# Patient Record
Sex: Female | Born: 1960 | Race: White | Hispanic: No | Marital: Married | Smoking: Former smoker
Health system: Southern US, Community
[De-identification: ages and names within clinical notes are randomized; demographics above are authoritative.]

## PROBLEM LIST (undated history)

## (undated) DIAGNOSIS — N6009 Solitary cyst of unspecified breast: Secondary | ICD-10-CM

## (undated) DIAGNOSIS — Z860100 Personal history of colon polyps, unspecified: Secondary | ICD-10-CM

## (undated) DIAGNOSIS — T7840XA Allergy, unspecified, initial encounter: Secondary | ICD-10-CM

## (undated) DIAGNOSIS — N809 Endometriosis, unspecified: Secondary | ICD-10-CM

## (undated) DIAGNOSIS — K219 Gastro-esophageal reflux disease without esophagitis: Secondary | ICD-10-CM

## (undated) HISTORY — PX: HIP ARTHROSCOPY: SHX668

## (undated) HISTORY — PX: COSMETIC SURGERY: SHX468

## (undated) HISTORY — PX: TONSILLECTOMY: SUR1361

## (undated) HISTORY — PX: OTHER SURGICAL HISTORY: SHX169

## (undated) HISTORY — PX: COLONOSCOPY: SHX174

## (undated) HISTORY — DX: Solitary cyst of unspecified breast: N60.09

## (undated) HISTORY — DX: Personal history of colon polyps, unspecified: Z86.0100

## (undated) HISTORY — DX: Personal history of colonic polyps: Z86.010

## (undated) HISTORY — DX: Gastro-esophageal reflux disease without esophagitis: K21.9

## (undated) HISTORY — PX: POLYPECTOMY: SHX149

## (undated) HISTORY — DX: Allergy, unspecified, initial encounter: T78.40XA

---

## 2006-11-26 ENCOUNTER — Encounter: Admission: RE | Admit: 2006-11-26 | Discharge: 2006-11-26 | Payer: Self-pay | Admitting: Obstetrics and Gynecology

## 2006-12-04 ENCOUNTER — Encounter: Admission: RE | Admit: 2006-12-04 | Discharge: 2006-12-04 | Payer: Self-pay | Admitting: Obstetrics and Gynecology

## 2006-12-04 HISTORY — PX: BREAST BIOPSY: SHX20

## 2008-06-16 ENCOUNTER — Encounter: Admission: RE | Admit: 2008-06-16 | Discharge: 2008-06-16 | Payer: Self-pay | Admitting: Obstetrics

## 2009-07-29 HISTORY — PX: OTHER SURGICAL HISTORY: SHX169

## 2012-06-22 ENCOUNTER — Other Ambulatory Visit: Payer: Self-pay | Admitting: Obstetrics

## 2012-06-22 DIAGNOSIS — Z1231 Encounter for screening mammogram for malignant neoplasm of breast: Secondary | ICD-10-CM

## 2012-07-20 ENCOUNTER — Ambulatory Visit
Admission: RE | Admit: 2012-07-20 | Discharge: 2012-07-20 | Disposition: A | Discharge status: Home / Self Care | Payer: Self-pay | Source: Ambulatory Visit | Attending: Obstetrics | Admitting: Obstetrics

## 2012-07-20 DIAGNOSIS — Z1231 Encounter for screening mammogram for malignant neoplasm of breast: Secondary | ICD-10-CM

## 2012-07-23 ENCOUNTER — Other Ambulatory Visit: Payer: Self-pay | Admitting: Obstetrics

## 2012-07-23 DIAGNOSIS — R928 Other abnormal and inconclusive findings on diagnostic imaging of breast: Secondary | ICD-10-CM

## 2012-07-29 ENCOUNTER — Ambulatory Visit
Admission: RE | Admit: 2012-07-29 | Discharge: 2012-07-29 | Disposition: A | Discharge status: Home / Self Care | Payer: Managed Care, Other (non HMO) | Source: Ambulatory Visit | Attending: Obstetrics | Admitting: Obstetrics

## 2012-07-29 DIAGNOSIS — R928 Other abnormal and inconclusive findings on diagnostic imaging of breast: Secondary | ICD-10-CM

## 2013-02-01 ENCOUNTER — Other Ambulatory Visit: Payer: Self-pay | Admitting: Obstetrics

## 2013-02-01 DIAGNOSIS — N6009 Solitary cyst of unspecified breast: Secondary | ICD-10-CM

## 2013-02-08 ENCOUNTER — Ambulatory Visit
Admission: RE | Admit: 2013-02-08 | Discharge: 2013-02-08 | Disposition: A | Discharge status: Home / Self Care | Payer: Managed Care, Other (non HMO) | Source: Ambulatory Visit | Attending: Obstetrics | Admitting: Obstetrics

## 2013-02-08 DIAGNOSIS — N6009 Solitary cyst of unspecified breast: Secondary | ICD-10-CM

## 2013-03-20 ENCOUNTER — Emergency Department: Payer: Self-pay | Admitting: Emergency Medicine

## 2013-03-23 ENCOUNTER — Emergency Department (HOSPITAL_COMMUNITY)
Admission: EM | Admit: 2013-03-23 | Discharge: 2013-03-23 | Disposition: A | Discharge status: Home / Self Care | Payer: Managed Care, Other (non HMO) | Attending: Emergency Medicine | Admitting: Emergency Medicine

## 2013-03-23 ENCOUNTER — Emergency Department (HOSPITAL_COMMUNITY): Payer: Managed Care, Other (non HMO)

## 2013-03-23 ENCOUNTER — Emergency Department (INDEPENDENT_AMBULATORY_CARE_PROVIDER_SITE_OTHER)
Admission: EM | Admit: 2013-03-23 | Discharge: 2013-03-23 | Disposition: A | Discharge status: Other Acute Inpatient Hospital | Payer: Managed Care, Other (non HMO) | Source: Home / Self Care | Attending: Family Medicine | Admitting: Family Medicine

## 2013-03-23 ENCOUNTER — Encounter (HOSPITAL_COMMUNITY): Payer: Self-pay

## 2013-03-23 ENCOUNTER — Encounter (HOSPITAL_COMMUNITY): Payer: Self-pay | Admitting: *Deleted

## 2013-03-23 DIAGNOSIS — Y92410 Unspecified street and highway as the place of occurrence of the external cause: Secondary | ICD-10-CM | POA: Insufficient documentation

## 2013-03-23 DIAGNOSIS — F17200 Nicotine dependence, unspecified, uncomplicated: Secondary | ICD-10-CM | POA: Insufficient documentation

## 2013-03-23 DIAGNOSIS — Z8739 Personal history of other diseases of the musculoskeletal system and connective tissue: Secondary | ICD-10-CM | POA: Insufficient documentation

## 2013-03-23 DIAGNOSIS — S0990XA Unspecified injury of head, initial encounter: Secondary | ICD-10-CM | POA: Insufficient documentation

## 2013-03-23 DIAGNOSIS — F29 Unspecified psychosis not due to a substance or known physiological condition: Secondary | ICD-10-CM | POA: Insufficient documentation

## 2013-03-23 DIAGNOSIS — IMO0002 Reserved for concepts with insufficient information to code with codable children: Secondary | ICD-10-CM | POA: Insufficient documentation

## 2013-03-23 DIAGNOSIS — F0781 Postconcussional syndrome: Secondary | ICD-10-CM | POA: Insufficient documentation

## 2013-03-23 DIAGNOSIS — Y9389 Activity, other specified: Secondary | ICD-10-CM | POA: Insufficient documentation

## 2013-03-23 DIAGNOSIS — R079 Chest pain, unspecified: Secondary | ICD-10-CM

## 2013-03-23 DIAGNOSIS — S298XXA Other specified injuries of thorax, initial encounter: Secondary | ICD-10-CM | POA: Insufficient documentation

## 2013-03-23 DIAGNOSIS — R0789 Other chest pain: Secondary | ICD-10-CM

## 2013-03-23 HISTORY — DX: Endometriosis, unspecified: N80.9

## 2013-03-23 LAB — CBC WITH DIFFERENTIAL/PLATELET
Basophils Absolute: 0 10*3/uL (ref 0.0–0.1)
Basophils Relative: 0 % (ref 0–1)
Eosinophils Relative: 2 % (ref 0–5)
Lymphocytes Relative: 33 % (ref 12–46)
Lymphs Abs: 2.7 10*3/uL (ref 0.7–4.0)
Monocytes Absolute: 0.4 10*3/uL (ref 0.1–1.0)
Monocytes Relative: 5 % (ref 3–12)
Neutro Abs: 5 10*3/uL (ref 1.7–7.7)
Neutrophils Relative %: 60 % (ref 43–77)
RBC: 4.76 MIL/uL (ref 3.87–5.11)
RDW: 12.2 % (ref 11.5–15.5)
WBC: 8.3 10*3/uL (ref 4.0–10.5)

## 2013-03-23 LAB — POCT I-STAT, CHEM 8
BUN: 13 mg/dL (ref 6–23)
Glucose, Bld: 95 mg/dL (ref 70–99)
HCT: 46 % (ref 36.0–46.0)
Hemoglobin: 15.6 g/dL — ABNORMAL HIGH (ref 12.0–15.0)
TCO2: 35 mmol/L (ref 0–100)
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
## 2013-03-23 NOTE — ED Notes (Signed)
Pt returned from radiology and placed on monitor 
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
## 2013-03-24 NOTE — ED Provider Notes (Signed)
Medical screening examination/treatment/procedure(s) were performed by non-physician practitioner and as supervising physician I was immediately available for consultation/collaboration.   MORENO-COLL,[name]; MD  [name], MD 03/24/13 1019 

## 2013-08-30 ENCOUNTER — Other Ambulatory Visit: Payer: Self-pay | Admitting: Obstetrics

## 2013-08-30 DIAGNOSIS — N6310 Unspecified lump in the right breast, unspecified quadrant: Secondary | ICD-10-CM

## 2013-09-21 ENCOUNTER — Ambulatory Visit
Admission: RE | Admit: 2013-09-21 | Discharge: 2013-09-21 | Disposition: A | Discharge status: Home / Self Care | Payer: Managed Care, Other (non HMO) | Source: Ambulatory Visit | Attending: Obstetrics | Admitting: Obstetrics

## 2013-09-21 DIAGNOSIS — N6310 Unspecified lump in the right breast, unspecified quadrant: Secondary | ICD-10-CM

## 2014-01-10 ENCOUNTER — Encounter: Payer: Self-pay | Admitting: Obstetrics

## 2014-11-02 ENCOUNTER — Other Ambulatory Visit: Payer: Self-pay | Admitting: Obstetrics

## 2014-11-02 DIAGNOSIS — N6310 Unspecified lump in the right breast, unspecified quadrant: Secondary | ICD-10-CM

## 2014-11-15 ENCOUNTER — Ambulatory Visit
Admission: RE | Admit: 2014-11-15 | Discharge: 2014-11-15 | Disposition: A | Discharge status: Home / Self Care | Payer: Managed Care, Other (non HMO) | Source: Ambulatory Visit | Attending: Obstetrics | Admitting: Obstetrics

## 2014-11-15 DIAGNOSIS — N6310 Unspecified lump in the right breast, unspecified quadrant: Secondary | ICD-10-CM

## 2015-02-21 ENCOUNTER — Encounter: Payer: Self-pay | Admitting: Obstetrics

## 2015-05-31 ENCOUNTER — Other Ambulatory Visit: Payer: Self-pay | Admitting: Obstetrics

## 2015-05-31 DIAGNOSIS — N6452 Nipple discharge: Secondary | ICD-10-CM

## 2015-06-04 ENCOUNTER — Ambulatory Visit
Admission: RE | Admit: 2015-06-04 | Discharge: 2015-06-04 | Disposition: A | Discharge status: Home / Self Care | Payer: Managed Care, Other (non HMO) | Source: Ambulatory Visit | Attending: Obstetrics | Admitting: Obstetrics

## 2015-06-04 ENCOUNTER — Other Ambulatory Visit: Payer: Self-pay | Admitting: Obstetrics

## 2015-06-04 DIAGNOSIS — N6452 Nipple discharge: Secondary | ICD-10-CM

## 2015-06-05 ENCOUNTER — Ambulatory Visit
Admission: RE | Admit: 2015-06-05 | Discharge: 2015-06-05 | Disposition: A | Discharge status: Home / Self Care | Payer: Managed Care, Other (non HMO) | Source: Ambulatory Visit | Attending: Obstetrics | Admitting: Obstetrics

## 2015-06-05 ENCOUNTER — Other Ambulatory Visit: Payer: Self-pay | Admitting: Obstetrics

## 2015-06-05 DIAGNOSIS — N6452 Nipple discharge: Secondary | ICD-10-CM

## 2015-06-05 HISTORY — PX: BREAST BIOPSY: SHX20

## 2015-06-22 ENCOUNTER — Other Ambulatory Visit: Payer: Self-pay | Admitting: General Surgery

## 2015-06-22 DIAGNOSIS — R928 Other abnormal and inconclusive findings on diagnostic imaging of breast: Secondary | ICD-10-CM

## 2015-07-04 ENCOUNTER — Other Ambulatory Visit: Payer: Self-pay | Admitting: General Surgery

## 2015-07-04 DIAGNOSIS — R928 Other abnormal and inconclusive findings on diagnostic imaging of breast: Secondary | ICD-10-CM

## 2015-07-26 ENCOUNTER — Encounter (HOSPITAL_BASED_OUTPATIENT_CLINIC_OR_DEPARTMENT_OTHER): Payer: Self-pay | Admitting: *Deleted

## 2015-07-27 ENCOUNTER — Ambulatory Visit
Admission: RE | Admit: 2015-07-27 | Discharge: 2015-07-27 | Disposition: A | Discharge status: Home / Self Care | Payer: Managed Care, Other (non HMO) | Source: Ambulatory Visit | Attending: General Surgery | Admitting: General Surgery

## 2015-07-27 DIAGNOSIS — R928 Other abnormal and inconclusive findings on diagnostic imaging of breast: Secondary | ICD-10-CM

## 2015-08-01 ENCOUNTER — Ambulatory Visit (HOSPITAL_BASED_OUTPATIENT_CLINIC_OR_DEPARTMENT_OTHER)
Admission: RE | Admit: 2015-08-01 | Discharge: 2015-08-01 | Disposition: A | Discharge status: Home / Self Care | Payer: Managed Care, Other (non HMO) | Source: Ambulatory Visit | Attending: General Surgery | Admitting: General Surgery

## 2015-08-01 ENCOUNTER — Encounter (HOSPITAL_BASED_OUTPATIENT_CLINIC_OR_DEPARTMENT_OTHER): Payer: Self-pay | Admitting: *Deleted

## 2015-08-01 ENCOUNTER — Ambulatory Visit (HOSPITAL_BASED_OUTPATIENT_CLINIC_OR_DEPARTMENT_OTHER): Payer: Managed Care, Other (non HMO) | Admitting: Anesthesiology

## 2015-08-01 ENCOUNTER — Encounter (HOSPITAL_BASED_OUTPATIENT_CLINIC_OR_DEPARTMENT_OTHER)
Admission: RE | Disposition: A | Discharge status: Home / Self Care | Payer: Self-pay | Source: Ambulatory Visit | Attending: General Surgery

## 2015-08-01 ENCOUNTER — Ambulatory Visit
Admission: RE | Admit: 2015-08-01 | Discharge: 2015-08-01 | Disposition: A | Discharge status: Home / Self Care | Payer: Managed Care, Other (non HMO) | Source: Ambulatory Visit | Attending: General Surgery | Admitting: General Surgery

## 2015-08-01 DIAGNOSIS — N6452 Nipple discharge: Secondary | ICD-10-CM | POA: Insufficient documentation

## 2015-08-01 DIAGNOSIS — Z87891 Personal history of nicotine dependence: Secondary | ICD-10-CM | POA: Insufficient documentation

## 2015-08-01 DIAGNOSIS — N6082 Other benign mammary dysplasias of left breast: Secondary | ICD-10-CM | POA: Diagnosis not present

## 2015-08-01 DIAGNOSIS — D242 Benign neoplasm of left breast: Secondary | ICD-10-CM | POA: Insufficient documentation

## 2015-08-01 DIAGNOSIS — R928 Other abnormal and inconclusive findings on diagnostic imaging of breast: Secondary | ICD-10-CM | POA: Diagnosis present

## 2015-08-01 HISTORY — PX: RADIOACTIVE SEED GUIDED EXCISIONAL BREAST BIOPSY: SHX6490

## 2015-08-01 HISTORY — PX: BREAST EXCISIONAL BIOPSY: SUR124

## 2015-08-01 LAB — POCT HEMOGLOBIN-HEMACUE: HEMOGLOBIN: 14 g/dL (ref 12.0–15.0)

## 2015-08-01 SURGERY — RADIOACTIVE SEED GUIDED BREAST BIOPSY
Anesthesia: General | Site: Breast | Laterality: Left

## 2015-08-01 MED ORDER — PROPOFOL 10 MG/ML IV BOLUS
INTRAVENOUS | Status: DC | PRN
  Administered 2015-08-01: 150 mg via INTRAVENOUS

## 2015-08-01 MED ORDER — FENTANYL CITRATE (PF) 100 MCG/2ML IJ SOLN
INTRAMUSCULAR | Status: AC
  Filled 2015-08-01: qty 2

## 2015-08-01 MED ORDER — ACETAMINOPHEN 325 MG PO TABS: 650.0000 mg | ORAL_TABLET | ORAL | Status: DC | PRN

## 2015-08-01 MED ORDER — SCOPOLAMINE 1 MG/3DAYS TD PT72: 1.0000 | MEDICATED_PATCH | Freq: Once | TRANSDERMAL | Status: DC | PRN

## 2015-08-01 MED ORDER — SODIUM CHLORIDE 0.9 % IV SOLN: 250.0000 mL | INTRAVENOUS | Status: DC | PRN

## 2015-08-01 MED ORDER — GLYCOPYRROLATE 0.2 MG/ML IJ SOLN: 0.2000 mg | Freq: Once | INTRAMUSCULAR | Status: DC | PRN

## 2015-08-01 MED ORDER — DEXAMETHASONE SODIUM PHOSPHATE 4 MG/ML IJ SOLN
INTRAMUSCULAR | Status: DC | PRN
  Administered 2015-08-01: 10 mg via INTRAVENOUS

## 2015-08-01 MED ORDER — LACTATED RINGERS IV SOLN
INTRAVENOUS | Status: DC
  Administered 2015-08-01 (×2): via INTRAVENOUS

## 2015-08-01 MED ORDER — ACETAMINOPHEN 650 MG RE SUPP: 650.0000 mg | RECTAL | Status: DC | PRN

## 2015-08-01 MED ORDER — BUPIVACAINE-EPINEPHRINE 0.5% -1:200000 IJ SOLN
INTRAMUSCULAR | Status: DC | PRN
  Administered 2015-08-01: 20 mL

## 2015-08-01 MED ORDER — LIDOCAINE HCL (CARDIAC) 20 MG/ML IV SOLN
INTRAVENOUS | Status: DC | PRN
  Administered 2015-08-01: 80 mg via INTRAVENOUS

## 2015-08-01 MED ORDER — FENTANYL CITRATE (PF) 100 MCG/2ML IJ SOLN
INTRAMUSCULAR | Status: AC
  Filled 2015-08-01: qty 6

## 2015-08-01 MED ORDER — OXYCODONE HCL 5 MG PO TABS
5.0000 mg | ORAL_TABLET | ORAL | Status: DC | PRN
Start: 2015-08-01 — End: 2015-08-01

## 2015-08-01 MED ORDER — MIDAZOLAM HCL 2 MG/2ML IJ SOLN
INTRAMUSCULAR | Status: AC
  Filled 2015-08-01: qty 2

## 2015-08-01 MED ORDER — MIDAZOLAM HCL 2 MG/2ML IJ SOLN
1.0000 mg | INTRAMUSCULAR | Status: DC | PRN
  Administered 2015-08-01: 2 mg via INTRAVENOUS

## 2015-08-01 MED ORDER — FENTANYL CITRATE (PF) 100 MCG/2ML IJ SOLN
50.0000 ug | INTRAMUSCULAR | Status: AC | PRN
  Administered 2015-08-01: 100 ug via INTRAVENOUS
  Administered 2015-08-01 (×2): 50 ug via INTRAVENOUS

## 2015-08-01 MED ORDER — OXYCODONE-ACETAMINOPHEN 5-325 MG PO TABS: 1.0000 | ORAL_TABLET | ORAL | Status: DC | PRN

## 2015-08-01 MED ORDER — CEFAZOLIN SODIUM-DEXTROSE 2-3 GM-% IV SOLR
2.0000 g | INTRAVENOUS | Status: AC
  Administered 2015-08-01: 2 g via INTRAVENOUS

## 2015-08-01 MED ORDER — ONDANSETRON HCL 4 MG/2ML IJ SOLN
INTRAMUSCULAR | Status: DC | PRN
  Administered 2015-08-01: 4 mg via INTRAVENOUS

## 2015-08-01 MED ORDER — BUPIVACAINE-EPINEPHRINE (PF) 0.5% -1:200000 IJ SOLN
INTRAMUSCULAR | Status: AC
  Filled 2015-08-01: qty 30

## 2015-08-01 MED ORDER — SODIUM CHLORIDE 0.9 % IJ SOLN: 3.0000 mL | Freq: Two times a day (BID) | INTRAMUSCULAR | Status: DC

## 2015-08-01 MED ORDER — SODIUM CHLORIDE 0.9 % IJ SOLN: 3.0000 mL | INTRAMUSCULAR | Status: DC | PRN

## 2015-08-01 SURGICAL SUPPLY — 50 items
BINDER BREAST MEDIUM (GAUZE/BANDAGES/DRESSINGS) ×3 IMPLANT
BLADE HEX COATED 2.75 (ELECTRODE) ×3 IMPLANT
BLADE SURG 10 STRL SS (BLADE) IMPLANT
BLADE SURG 15 STRL LF DISP TIS (BLADE) ×1 IMPLANT
BLADE SURG 15 STRL SS (BLADE) ×2
CANISTER SUC SOCK COL 7IN (MISCELLANEOUS) IMPLANT
CANISTER SUCT 1200ML W/VALVE (MISCELLANEOUS) ×3 IMPLANT
CHLORAPREP W/TINT 26ML (MISCELLANEOUS) ×3 IMPLANT
CLIP TI LARGE 6 (CLIP) IMPLANT
CLOSURE WOUND 1/2 X4 (GAUZE/BANDAGES/DRESSINGS) ×1
COVER BACK TABLE 60X90IN (DRAPES) ×3 IMPLANT
COVER MAYO STAND STRL (DRAPES) IMPLANT
COVER PROBE W GEL 5X96 (DRAPES) ×3 IMPLANT
DECANTER SPIKE VIAL GLASS SM (MISCELLANEOUS) IMPLANT
DEVICE DUBIN W/COMP PLATE 8390 (MISCELLANEOUS) ×3 IMPLANT
DRAPE UNIVERSAL PACK (DRAPES) ×3 IMPLANT
DRAPE UTILITY XL STRL (DRAPES) ×3 IMPLANT
ELECT REM PT RETURN 9FT ADLT (ELECTROSURGICAL) ×3
ELECTRODE REM PT RTRN 9FT ADLT (ELECTROSURGICAL) ×1 IMPLANT
GLOVE BIO SURGEON STRL SZ 6 (GLOVE) ×3 IMPLANT
GLOVE BIOGEL PI IND STRL 6.5 (GLOVE) ×1 IMPLANT
GLOVE BIOGEL PI INDICATOR 6.5 (GLOVE) ×2
GLOVE SURG SS PI 7.0 STRL IVOR (GLOVE) ×3 IMPLANT
GOWN STRL REUS W/ TWL LRG LVL3 (GOWN DISPOSABLE) ×1 IMPLANT
GOWN STRL REUS W/TWL 2XL LVL3 (GOWN DISPOSABLE) ×3 IMPLANT
GOWN STRL REUS W/TWL LRG LVL3 (GOWN DISPOSABLE) ×2
KIT MARKER MARGIN INK (KITS) ×3 IMPLANT
LIQUID BAND (GAUZE/BANDAGES/DRESSINGS) ×3 IMPLANT
NEEDLE HYPO 25X1 1.5 SAFETY (NEEDLE) ×3 IMPLANT
NS IRRIG 1000ML POUR BTL (IV SOLUTION) ×3 IMPLANT
PACK BASIN DAY SURGERY FS (CUSTOM PROCEDURE TRAY) ×3 IMPLANT
PENCIL BUTTON HOLSTER BLD 10FT (ELECTRODE) ×3 IMPLANT
SLEEVE SCD COMPRESS KNEE MED (MISCELLANEOUS) ×3 IMPLANT
SPONGE GAUZE 4X4 12PLY STER LF (GAUZE/BANDAGES/DRESSINGS) ×3 IMPLANT
SPONGE LAP 18X18 X RAY DECT (DISPOSABLE) ×3 IMPLANT
STAPLER VISISTAT 35W (STAPLE) IMPLANT
STRIP CLOSURE SKIN 1/2X4 (GAUZE/BANDAGES/DRESSINGS) ×2 IMPLANT
SUT MON AB 4-0 PC3 18 (SUTURE) ×3 IMPLANT
SUT SILK 2 0 SH (SUTURE) IMPLANT
SUT VIC AB 2-0 SH 18 (SUTURE) IMPLANT
SUT VIC AB 3-0 SH 27 (SUTURE)
SUT VIC AB 3-0 SH 27X BRD (SUTURE) IMPLANT
SUT VICRYL 3-0 CR8 SH (SUTURE) ×3 IMPLANT
SYR BULB 3OZ (MISCELLANEOUS) ×3 IMPLANT
SYR CONTROL 10ML LL (SYRINGE) ×3 IMPLANT
TOWEL OR 17X24 6PK STRL BLUE (TOWEL DISPOSABLE) ×3 IMPLANT
TOWEL OR NON WOVEN STRL DISP B (DISPOSABLE) IMPLANT
TUBE CONNECTING 20'X1/4 (TUBING) ×1
TUBE CONNECTING 20X1/4 (TUBING) ×2 IMPLANT
YANKAUER SUCT BULB TIP NO VENT (SUCTIONS) ×3 IMPLANT

## 2015-08-01 NOTE — Discharge Instructions (Addendum)
[facility] °Office Phone Number [phone] ° °BREAST BIOPSY/ PARTIAL MASTECTOMY: POST OP INSTRUCTIONS ° °Always review your discharge instruction sheet given to you by the facility where your surgery was performed. ° °IF YOU HAVE DISABILITY OR FAMILY LEAVE FORMS, YOU MUST BRING THEM TO THE OFFICE FOR PROCESSING.  DO NOT GIVE THEM TO YOUR DOCTOR. ° °1. A prescription for pain medication may be given to you upon discharge.  Take your pain medication as prescribed, if needed.  If narcotic pain medicine is not needed, then you may take acetaminophen (Tylenol) or ibuprofen (Advil) as needed. °2. Take your usually prescribed medications unless otherwise directed °3. If you need a refill on your pain medication, please contact your pharmacy.  They will contact our office to request authorization.  Prescriptions will not be filled after 5pm or on week-ends. °4. You should eat very light the first 24 hours after surgery, such as soup, crackers, pudding, etc.  Resume your normal diet the day after surgery. °5. Most patients will experience some swelling and bruising in the breast.  Ice packs and a good support bra will help.  Swelling and bruising can take several days to resolve.  °6. It is common to experience some constipation if taking pain medication after surgery.  Increasing fluid intake and taking a stool softener will usually help or prevent this problem from occurring.  A mild laxative (Milk of Magnesia or Miralax) should be taken according to package directions if there are no bowel movements after 48 hours. °7. Unless discharge instructions indicate otherwise, you may remove your bandages 48 hours after surgery, and you may shower at that time.  You may have steri-strips (small skin tapes) in place directly over the incision.  These strips should be left on the skin for 7-10 days.   Any sutures or staples will be removed at the office during your follow-up visit. °8. ACTIVITIES:  You may resume  regular daily activities (gradually increasing) beginning the next day.  Wearing a good support bra or sports bra (or the breast binder) minimizes pain and swelling.  You may have sexual intercourse when it is comfortable. °a. You may drive when you no longer are taking prescription pain medication, you can comfortably wear a seatbelt, and you can safely maneuver your car and apply brakes. °b. RETURN TO WORK:  __________1 week_______________ °9. You should see your doctor in the office for a follow-up appointment approximately two weeks after your surgery.  Your doctor’s nurse will typically make your follow-up appointment when she calls you with your pathology report.  Expect your pathology report 2-3 business days after your surgery.  You may call to check if you do not hear from us after three days. ° ° °WHEN TO CALL YOUR DOCTOR: °1. Fever over 101.0 °2. Nausea and/or vomiting. °3. Extreme swelling or bruising. °4. Continued bleeding from incision. °5. Increased pain, redness, or drainage from the incision. ° °The clinic staff is available to answer your questions during regular business hours.  Please don’t hesitate to call and ask to speak to one of the nurses for clinical concerns.  If you have a medical emergency, go to the nearest emergency room or call 911.  A surgeon from [facility] is always on call at the hospital. ° °For further questions, please visit [email or web address]  ° ° °Post Anesthesia Home Care Instructions ° °Activity: °Get plenty of rest for the remainder of the day. A responsible adult should stay with you for 24   hours following the procedure.  °For the next 24 hours, DO NOT: °-Drive a car °-Operate machinery °-Drink alcoholic beverages °-Take any medication unless instructed by your physician °-Make any legal decisions or sign important papers. ° °Meals: °Start with liquid foods such as gelatin or soup. Progress to regular foods as tolerated. Avoid greasy, spicy, heavy  foods. If nausea and/or vomiting occur, drink only clear liquids until the nausea and/or vomiting subsides. Call your physician if vomiting continues. ° °Special Instructions/Symptoms: °Your throat may feel dry or sore from the anesthesia or the breathing tube placed in your throat during surgery. If this causes discomfort, gargle with warm salt water. The discomfort should disappear within 24 hours. ° °If you had a scopolamine patch placed behind your ear for the management of post- operative nausea and/or vomiting: ° °1. The medication in the patch is effective for 72 hours, after which it should be removed.  Wrap patch in a tissue and discard in the trash. Wash hands thoroughly with soap and water. °2. You may remove the patch earlier than 72 hours if you experience unpleasant side effects which may include dry mouth, dizziness or visual disturbances. °3. Avoid touching the patch. Wash your hands with soap and water after contact with the patch. °  ° °

## 2015-08-01 NOTE — Op Note (Signed)
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    Pre-operative Diagnosis: bloody nipple discharge, abnormal mammogram  Post-operative Diagnosis: same  Surgeon: ,   Anesthesia: General endotracheal anesthesia  ASA Class: 1  Procedure Details  The patient was seen in the Holding Room. The risks, benefits, complications, treatment options, and expected outcomes were discussed with the patient. The possibilities of bleeding, infection, the need for additional procedures, failure to diagnose a condition, and creating a complication requiring transfusion or operation were discussed with the patient. The patient concurred with the proposed plan, giving informed consent.  The site of surgery properly noted/marked. The patient was taken to Operating Room # 2, identified, and the procedure verified as left Breast seed localized excisional biopsy. A Time Out was held and the above information confirmed.  The left breast and chest were prepped and draped in standard fashion. The lumpectomy was performed by creating a circumareolar incision over the lower outer quadrant of the breast over the previously placed radioactive seed. Sharp dissection was used under the nipple.   Dissection was carried down to around the point of maximum signal intensity. The cautery was used to perform the dissection.  Hemostasis was achieved with cautery. The edges of the cavity were marked with large clips, with one each medial, lateral, inferior and superior, and two clips posteriorly.   The specimen was inked with the margin marker paint kit.    Specimen radiography confirmed inclusion of the mammographic lesion, the clip, and the seed.  The background signal in the breast was zero.  The wound was irrigated and closed with 3-0 vicryl in layers and 4-0 monocryl  subcuticular suture.      Sterile dressings were applied. At the end of the operation, all sponge, instrument, and needle counts were correct.  Findings: grossly clear surgical margins and no adenopathy.  Anterior margin is nipple  Estimated Blood Loss:  min         Specimens: left breast excisional biopsy         Complications:  None; patient tolerated the procedure well.         Disposition: PACU - hemodynamically stable.         Condition: stable   

## 2015-08-01 NOTE — Transfer of Care (Signed)
Immediate Anesthesia Transfer of Care Note  Patient: Martha Frank  Procedure(s) Performed: Procedure(s): RADIOACTIVE SEED GUIDED EXCISIONAL LEFT BREAST BIOPSY (Left)  Patient Location: PACU  Anesthesia Type:General  Level of Consciousness: sedated  Airway & Oxygen Therapy: Patient Spontanous Breathing and Patient connected to face mask oxygen  Post-op Assessment: Report given to RN and Post -op Vital signs reviewed and stable  Post vital signs: Reviewed and stable  Last Vitals:  Filed Vitals:   08/01/15 1433  BP:   Pulse: 65  Temp:   Resp: 34    Complications: No apparent anesthesia complications

## 2015-08-01 NOTE — Anesthesia Procedure Notes (Signed)
Procedure Name: LMA Insertion Date/Time: 08/01/2015 1:45 PM Performed by: Maryella Shivers Pre-anesthesia Checklist: Patient identified, Emergency Drugs available, Suction available and Patient being monitored Patient Re-evaluated:Patient Re-evaluated prior to inductionOxygen Delivery Method: Circle System Utilized Preoxygenation: Pre-oxygenation with 100% oxygen Intubation Type: IV induction Ventilation: Mask ventilation without difficulty LMA: LMA inserted LMA Size: 4.0 Number of attempts: 1 Airway Equipment and Method: Bite block Placement Confirmation: positive ETCO2 Tube secured with: Tape Dental Injury: Teeth and Oropharynx as per pre-operative assessment

## 2015-08-01 NOTE — Anesthesia Preprocedure Evaluation (Signed)
Anesthesia Evaluation  Patient identified by MRN, date of birth, ID band Patient awake    Reviewed: Allergy & Precautions, NPO status , Patient's Chart, lab work & pertinent test results  Airway Mallampati: I  TM Distance: >3 FB Neck ROM: Full    Dental  (+) Teeth Intact, Dental Advisory Given   Pulmonary former smoker,  breath sounds clear to auscultation        Cardiovascular Rhythm:Regular Rate:Normal     Neuro/Psych    GI/Hepatic   Endo/Other    Renal/GU      Musculoskeletal   Abdominal   Peds  Hematology   Anesthesia Other Findings   Reproductive/Obstetrics                             Anesthesia Physical Anesthesia Plan  ASA: I  Anesthesia Plan: General   Post-op Pain Management:    Induction: Intravenous  Airway Management Planned: LMA  Additional Equipment:   Intra-op Plan:   Post-operative Plan: Extubation in OR  Informed Consent: I have reviewed the patients History and Physical, chart, labs and discussed the procedure including the risks, benefits and alternatives for the proposed anesthesia with the patient or authorized representative who has indicated his/her understanding and acceptance.   Dental advisory given  Plan Discussed with: CRNA, Anesthesiologist and Surgeon  Anesthesia Plan Comments:        Anesthesia Quick Evaluation  

## 2015-08-01 NOTE — Interval H&P Note (Signed)
History and Physical Interval Note:  08/01/2015 1:27 PM  Martha Frank  has presented today for surgery, with the diagnosis of ABNORMAL MAMMOGRAM, LEFT BREAST PAPILLOMA  The various methods of treatment have been discussed with the patient and family. After consideration of risks, benefits and other options for treatment, the patient has consented to  Procedure(s): RADIOACTIVE SEED GUIDED EXCISIONAL LEFT BREAST BIOPSY (Left) as a surgical intervention .  The patient's history has been reviewed, patient examined, no change in status, stable for surgery.  I have reviewed the patient's chart and labs.  Questions were answered to the patient's satisfaction.     Ryleeann Urquiza

## 2015-08-01 NOTE — H&P (Signed)
Martha Frank Location: [facility] Patient #: [id number] DOB: [birth year] Married / Language: English / Race: White Female  History of Present Illness Patient words: papilloma L breast.  The patient is a 54 year old female who presents with a complaint of nipple discharge. The patient is a 53 year old female referred by Dr. Luberta Robertson for consultation for left bloody nipple discharge and abnormal mammogram. The patient was having bloody nipple discharge and had diagnostic imaging. A small mass was seen at 2:30 on the left that was 6 millimeters in greatest dimension. A ductogram was attempted but was not successful. The mass was biopsied and was found to be intraductal papilloma on pathology. The patient is referred for excisional biopsy. Of note, she did have a mother and maternal aunt who had breast cancer. Her maternal grandmother had some type of cancer but her family is not sure what type this was. Her father had prostate cancer. She has not had to have breast biopsies in the past. The nipple discharge has significantly decreased since the core needle biopsy because of the swelling that she has in this breast. It is no longer spontaneous. She does have some soreness of the breast since the biopsy.   Other Problems Bladder Problems  Past Surgical History Breast Biopsy Left. multiple Hip Surgery Right. Tonsillectomy  Diagnostic Studies History Colonoscopy never Mammogram within last year Pap Smear 1-5 years ago  Allergies No Known Drug Allergies06/24/2016  Medication History Myrbetriq (25MG  Tablet ER 24HR, Oral) Active. Vitamin D (2000UNIT Capsule, Oral) Active. Medications Reconciled  Social History Alcohol use Moderate alcohol use. Caffeine use Carbonated beverages, Tea. Illicit drug use Remotely quit drug use. Tobacco use Former smoker.  Family History Breast Cancer Family Members In General, Mother. Diabetes Mellitus  Father. Heart Disease Father. Heart disease in female family member before age 37 Prostate Cancer Father.  Pregnancy / Birth History  Age at menarche 37 years. Age of menopause 45-50 Contraceptive History Oral contraceptives. Gravida 2 Irregular periods Maternal age 38-30 Para 2  Review of Systems General Not Present- Appetite Loss, Chills, Fatigue, Fever, Night Sweats, Weight Gain and Weight Loss. Skin Not Present- Change in Wart/Mole, Dryness, Hives, Jaundice, New Lesions, Non-Healing Wounds, Rash and Ulcer. HEENT Not Present- Earache, Hearing Loss, Hoarseness, Nose Bleed, Oral Ulcers, Ringing in the Ears, Seasonal Allergies, Sinus Pain, Sore Throat, Visual Disturbances, Wears glasses/contact lenses and Yellow Eyes. Respiratory Not Present- Bloody sputum, Chronic Cough, Difficulty Breathing, Snoring and Wheezing. Breast Present- Nipple Discharge. Not Present- Breast Mass, Breast Pain and Skin Changes. Cardiovascular Not Present- Chest Pain, Difficulty Breathing Lying Down, Leg Cramps, Palpitations, Rapid Heart Rate, Shortness of Breath and Swelling of Extremities. Gastrointestinal Not Present- Abdominal Pain, Bloating, Bloody Stool, Change in Bowel Habits, Chronic diarrhea, Constipation, Difficulty Swallowing, Excessive gas, Gets full quickly at meals, Hemorrhoids, Indigestion, Nausea, Rectal Pain and Vomiting. Female Genitourinary Present- Frequency, Nocturia and Urgency. Not Present- Painful Urination and Pelvic Pain. Musculoskeletal Present- Back Pain. Not Present- Joint Pain, Joint Stiffness, Muscle Pain, Muscle Weakness and Swelling of Extremities. Endocrine Present- Cold Intolerance. Not Present- Excessive Hunger, Hair Changes, Heat Intolerance, Hot flashes and New Diabetes.   Vitals  Wt Readings from Last 3 Encounters:  08/01/15 50.349 kg (111 lb)   Temp Readings from Last 3 Encounters:  08/01/15 98.2 F (36.8 C) Oral  03/23/13 97.6 F (36.4 C) Oral  03/23/13 97  F (36.1 C) Oral   BP Readings from Last 3 Encounters:  08/01/15 118/64  03/23/13 116/71  03/23/13 118/73  Pulse Readings from Last 3 Encounters:  08/01/15 67  03/23/13 62  03/23/13 78       Physical Exam General Mental Status-Alert. General Appearance-Consistent with stated age. Hydration-Well hydrated. Voice-Normal.  Head and Neck Head-normocephalic, atraumatic with no lesions or palpable masses. Trachea-midline. Thyroid Gland Characteristics - normal size and consistency.  Eye Eyeball - Bilateral-Extraocular movements intact. Sclera/Conjunctiva - Bilateral-No scleral icterus.  Chest and Lung Exam Chest and lung exam reveals -quiet, even and easy respiratory effort with no use of accessory muscles and on auscultation, normal breath sounds, no adventitious sounds and normal vocal resonance. Inspection Chest Wall - Normal. Back - normal.  Breast Note: Bilateral breasts have minimal ptosis. The left breast is larger than the right breast. There is no significant bruising. There is a small amount of nipple retraction on the left. Nipple discharge can be elicited with pressure on the nipple. This is serosanguineous. The duct where the discharge is seen appears to be approximately at 4:00. There is no mass in either breast. There is no axillary adenopathy on either side. The right side has no nipple discharge or nipple retraction. Breasts are relatively dense bilaterally.   Cardiovascular Cardiovascular examination reveals -normal heart sounds, regular rate and rhythm with no murmurs and normal pedal pulses bilaterally.  Abdomen Inspection Inspection of the abdomen reveals - No Hernias. Palpation/Percussion Palpation and Percussion of the abdomen reveal - Soft, Non Tender, No Rebound tenderness, No Rigidity (guarding) and No hepatosplenomegaly. Auscultation Auscultation of the abdomen reveals - Bowel sounds normal.  Neurologic Neurologic  evaluation reveals -alert and oriented x 3 with no impairment of recent or remote memory. Mental Status-Normal.  Musculoskeletal Global Assessment -Note: no gross deformities.  Normal Exam - Left-Upper Extremity Strength Normal and Lower Extremity Strength Normal. Normal Exam - Right-Upper Extremity Strength Normal and Lower Extremity Strength Normal.  Lymphatic Head & Neck  General Head & Neck Lymphatics: Bilateral - Description - Normal. Axillary  General Axillary Region: Bilateral - Description - Normal. Tenderness - Non Tender. Femoral & Inguinal  Generalized Femoral & Inguinal Lymphatics: Bilateral - Description - No Generalized lymphadenopathy.    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  I discussed a seed localized excisional biopsy with the patient.  The surgical procedure was described to the patient. I discussed the incision type and location and that we would need radiology involved on with a wire or seed marker and/or sentinel node.  The risks and benefits of the procedure were described to the patient and she wishes to proceed.  We discussed the risks bleeding, infection, damage to other structures, need for further procedures/surgeries. We discussed the risk of seroma. The patient was advised if the area in the breast in cancer, we may need to go back to surgery for additional tissue to obtain negative margins or for a lymph node biopsy. The patient was advised that these are the most common complications, but that others can occur as well. They were advised against taking aspirin or other anti-inflammatory agents/blood thinners the week before surgery.  She understands and wishes to proceed. BLOODY DISCHARGE FROM LEFT NIPPLE (611.79  N64.52) Impression: I do think her risk of cancer is higher than average given her family  history.  Certainly if she does have cancer, she would need genetic testing. ABNORMAL MAMMOGRAM OF LEFT BREAST (793.80  R92.8) Current Plans  Pt Education -  [facility] Breast Biopsy Instructions (FLB): discussed with patient and provided information. Follow up for surgery, then in clinic 2-3 weeks post op. Schedule for Surgery

## 2015-08-01 NOTE — Anesthesia Postprocedure Evaluation (Signed)
  Anesthesia Post-op Note  Patient: Martha Frank  Procedure(s) Performed: Procedure(s): RADIOACTIVE SEED GUIDED EXCISIONAL LEFT BREAST BIOPSY (Left)  Patient Location: PACU  Anesthesia Type: General   Level of Consciousness: awake, alert  and oriented  Airway and Oxygen Therapy: Patient Spontanous Breathing  Post-op Pain: mild  Post-op Assessment: Post-op Vital signs reviewed  Post-op Vital Signs: Reviewed  Last Vitals:  Filed Vitals:   08/01/15 1531  BP: 117/75  Pulse: 69  Temp: 36.1 C  Resp: 18    Complications: No apparent anesthesia complications

## 2015-08-02 ENCOUNTER — Encounter (HOSPITAL_BASED_OUTPATIENT_CLINIC_OR_DEPARTMENT_OTHER): Payer: Self-pay | Admitting: General Surgery

## 2015-08-03 NOTE — Progress Notes (Signed)
Quick Note:  Please let patient know path is negative for cancer ______

## 2016-01-21 ENCOUNTER — Other Ambulatory Visit: Payer: Self-pay

## 2016-01-21 DIAGNOSIS — Z1231 Encounter for screening mammogram for malignant neoplasm of breast: Secondary | ICD-10-CM

## 2016-01-31 ENCOUNTER — Ambulatory Visit
Admission: RE | Admit: 2016-01-31 | Discharge: 2016-01-31 | Disposition: A | Discharge status: Home / Self Care | Payer: Managed Care, Other (non HMO) | Source: Ambulatory Visit

## 2016-01-31 DIAGNOSIS — Z1231 Encounter for screening mammogram for malignant neoplasm of breast: Secondary | ICD-10-CM

## 2016-02-08 ENCOUNTER — Encounter: Payer: Self-pay | Admitting: Gastroenterology

## 2016-03-25 ENCOUNTER — Ambulatory Visit (AMBULATORY_SURGERY_CENTER): Payer: Self-pay

## 2016-03-25 VITALS — Ht 62.0 in | Wt 112.8 lb

## 2016-03-25 DIAGNOSIS — Z1211 Encounter for screening for malignant neoplasm of colon: Secondary | ICD-10-CM

## 2016-03-25 MED ORDER — NA SULFATE-K SULFATE-MG SULF 17.5-3.13-1.6 GM/177ML PO SOLN: ORAL | Status: DC

## 2016-03-25 NOTE — Progress Notes (Signed)
Per pt, no allergies to soy or egg products.Pt not taking any weight loss meds or using  O2 at home. 

## 2016-04-08 ENCOUNTER — Ambulatory Visit (AMBULATORY_SURGERY_CENTER): Payer: Managed Care, Other (non HMO) | Admitting: Gastroenterology

## 2016-04-08 ENCOUNTER — Encounter: Payer: Self-pay | Admitting: Gastroenterology

## 2016-04-08 VITALS — BP 111/62 | HR 60 | Temp 98.0°F | Resp 14 | Ht 62.0 in | Wt 112.0 lb

## 2016-04-08 DIAGNOSIS — Z1211 Encounter for screening for malignant neoplasm of colon: Secondary | ICD-10-CM

## 2016-04-08 DIAGNOSIS — D124 Benign neoplasm of descending colon: Secondary | ICD-10-CM | POA: Diagnosis not present

## 2016-04-08 DIAGNOSIS — D120 Benign neoplasm of cecum: Secondary | ICD-10-CM

## 2016-04-08 MED ORDER — SODIUM CHLORIDE 0.9 % IV SOLN: 500.0000 mL | INTRAVENOUS | Status: DC

## 2016-04-08 NOTE — Op Note (Signed)
[place] Patient Name: Martha Frank Procedure Date: 04/08/2016 10:20 AM MRN: [id number] Endoscopist: Mauri Pole , MD Age: 55 Date of Birth: 04-May-1961 Gender: Female Procedure:                Colonoscopy Indications:              Screening for colorectal malignant neoplasm Medicines:                Monitored Anesthesia Care Procedure:                Pre-Anesthesia Assessment:                           - Prior to the procedure, a History and Physical                            was performed, and patient medications and                            allergies were reviewed. The patient's tolerance of                            previous anesthesia was also reviewed. The risks                            and benefits of the procedure and the sedation                            options and risks were discussed with the patient.                            All questions were answered, and informed consent                            was obtained. Prior Anticoagulants: The patient has                            taken no previous anticoagulant or antiplatelet                            agents. ASA Grade Assessment: II - A patient with                            mild systemic disease. After reviewing the risks                            and benefits, the patient was deemed in                            satisfactory condition to undergo the procedure.                           After obtaining informed consent, the colonoscope  was passed under direct vision. Throughout the                            procedure, the patient's blood pressure, pulse, and                            oxygen saturations were monitored continuously. The                            Model CF-HQ190L [id number]) scope was introduced                            through the anus and advanced to the the cecum,                            identified by appendiceal orifice and ileocecal                            valve. The colonoscopy was performed without                            difficulty. The patient tolerated the procedure                            well. The quality of the bowel preparation was                            good. The ileocecal valve, appendiceal orifice, and                            rectum were photographed. Scope In: 10:41:31 AM Scope Out: 11:00:50 AM Scope Withdrawal Time: 0 hours 11 minutes 6 seconds  Total Procedure Duration: 0 hours 19 minutes 19 seconds  Findings:                 The perianal and digital rectal examinations were                            normal.                           A 4 mm polyp was found in the cecum. The polyp was                            sessile. The polyp was removed with a cold biopsy                            forceps. Resection and retrieval were complete.                           A 15 mm polyp was found in the descending colon.                            The polyp was semi-pedunculated. The polyp was  removed with a hot snare. Resection and retrieval                            were complete.                           The exam was otherwise without abnormality. Complications:            No immediate complications. Estimated Blood Loss:     Estimated blood loss: none. Impression:               - One 4 mm polyp in the cecum, removed with a cold                            biopsy forceps. Resected and retrieved.                           - One 15 mm polyp in the descending colon, removed                            with a hot snare. Resected and retrieved.                           - The examination was otherwise normal. Recommendation:           - Patient has a contact number available for                            emergencies. The signs and symptoms of potential                            delayed complications were discussed with the                            patient. Return to normal  activities tomorrow.                            Written discharge instructions were provided to the                            patient.                           - Resume previous diet.                           - Continue present medications.                           - Await pathology results.                           - Repeat colonoscopy in 3 years for surveillance.                           - Return to GI clinic PRN. Mauri Pole, MD 04/08/2016 11:05:10  AM This report has been signed electronically.

## 2016-04-08 NOTE — Progress Notes (Signed)
Called to room to assist during endoscopic procedure.  Patient ID and intended procedure confirmed with present staff. Received instructions for my participation in the procedure from the performing physician.  

## 2016-04-08 NOTE — Progress Notes (Signed)
Patient awakening,vss,report to rn 

## 2016-04-08 NOTE — Patient Instructions (Signed)
YOU HAD AN ENDOSCOPIC PROCEDURE TODAY AT [facility]:   Refer to the procedure report that was given to you for any specific questions about what was found during the examination.  If the procedure report does not answer your questions, please call your gastroenterologist to clarify.  If you requested that your care partner not be given the details of your procedure findings, then the procedure report has been included in a sealed envelope for you to review at your convenience later.  YOU SHOULD EXPECT: Some feelings of bloating in the abdomen. Passage of more gas than usual.  Walking can help get rid of the air that was put into your GI tract during the procedure and reduce the bloating. If you had a lower endoscopy (such as a colonoscopy or flexible sigmoidoscopy) you may notice spotting of blood in your stool or on the toilet paper. If you underwent a bowel prep for your procedure, you may not have a normal bowel movement for a few days.  Please Note:  You might notice some irritation and congestion in your nose or some drainage.  This is from the oxygen used during your procedure.  There is no need for concern and it should clear up in a day or so.  SYMPTOMS TO REPORT IMMEDIATELY:   Following lower endoscopy (colonoscopy or flexible sigmoidoscopy):  Excessive amounts of blood in the stool  Significant tenderness or worsening of abdominal pains  Swelling of the abdomen that is new, acute  Fever of 100F or higher    For urgent or emergent issues, a gastroenterologist can be reached at any hour by calling [phone].   DIET: Your first meal following the procedure should be a small meal and then it is ok to progress to your normal diet. Heavy or fried foods are harder to digest and may make you feel nauseous or bloated.  Likewise, meals heavy in dairy and vegetables can increase bloating.  Drink plenty of fluids but you should avoid alcoholic beverages for 24  hours.  ACTIVITY:  You should plan to take it easy for the rest of today and you should NOT DRIVE or use heavy machinery until tomorrow (because of the sedation medicines used during the test).    FOLLOW UP: Our staff will call the number listed on your records the next business day following your procedure to check on you and address any questions or concerns that you may have regarding the information given to you following your procedure. If we do not reach you, we will leave a message.  However, if you are feeling well and you are not experiencing any problems, there is no need to return our call.  We will assume that you have returned to your regular daily activities without incident.  If any biopsies were taken you will be contacted by phone or by letter within the next 1-3 weeks.  Please call us at [phone] if you have not heard about the biopsies in 3 weeks.    SIGNATURES/CONFIDENTIALITY: You and/or your care partner have signed paperwork which will be entered into your electronic medical record.  These signatures attest to the fact that that the information above on your After Visit Summary has been reviewed and is understood.  Full responsibility of the confidentiality of this discharge information lies with you and/or your care-partner.  Resume medications. Information given on polyps and high fiber diet.

## 2016-04-09 ENCOUNTER — Telehealth: Payer: Self-pay

## 2016-04-09 NOTE — Telephone Encounter (Signed)
  Follow up Call-  Call back number 04/08/2016  Post procedure Call Back phone  # [phone]  Permission to leave phone message Yes    Patient was called for follow up after her procedure on 04/08/2016. No answer at the number given for follow up phone call. A message was left on the answering machine.

## 2016-04-16 ENCOUNTER — Encounter: Payer: Self-pay | Admitting: Gastroenterology

## 2017-01-27 ENCOUNTER — Other Ambulatory Visit: Payer: Self-pay | Admitting: Obstetrics

## 2017-01-27 DIAGNOSIS — Z1231 Encounter for screening mammogram for malignant neoplasm of breast: Secondary | ICD-10-CM

## 2017-03-05 ENCOUNTER — Ambulatory Visit: Payer: Self-pay

## 2017-03-20 ENCOUNTER — Ambulatory Visit
Admission: RE | Admit: 2017-03-20 | Discharge: 2017-03-20 | Disposition: A | Discharge status: Home / Self Care | Payer: Managed Care, Other (non HMO) | Source: Ambulatory Visit | Attending: Obstetrics | Admitting: Obstetrics

## 2017-03-20 DIAGNOSIS — Z1231 Encounter for screening mammogram for malignant neoplasm of breast: Secondary | ICD-10-CM

## 2018-06-21 ENCOUNTER — Other Ambulatory Visit: Payer: Self-pay | Admitting: Obstetrics

## 2018-06-21 DIAGNOSIS — Z1231 Encounter for screening mammogram for malignant neoplasm of breast: Secondary | ICD-10-CM

## 2018-07-14 ENCOUNTER — Ambulatory Visit
Admission: RE | Admit: 2018-07-14 | Discharge: 2018-07-14 | Disposition: A | Discharge status: Home / Self Care | Payer: 59 | Source: Ambulatory Visit | Attending: Obstetrics | Admitting: Obstetrics

## 2018-07-14 DIAGNOSIS — Z1231 Encounter for screening mammogram for malignant neoplasm of breast: Secondary | ICD-10-CM

## 2019-05-06 ENCOUNTER — Encounter: Payer: Self-pay | Admitting: Gastroenterology

## 2019-06-14 ENCOUNTER — Encounter: Payer: Self-pay | Admitting: Gastroenterology

## 2019-06-14 ENCOUNTER — Other Ambulatory Visit: Payer: Self-pay | Admitting: Obstetrics

## 2019-06-14 DIAGNOSIS — Z1231 Encounter for screening mammogram for malignant neoplasm of breast: Secondary | ICD-10-CM

## 2019-06-30 ENCOUNTER — Other Ambulatory Visit: Payer: Self-pay

## 2019-06-30 ENCOUNTER — Ambulatory Visit: Payer: 59 | Admitting: *Deleted

## 2019-06-30 VITALS — Ht 62.0 in | Wt 112.0 lb

## 2019-06-30 DIAGNOSIS — Z86010 Personal history of colonic polyps: Secondary | ICD-10-CM

## 2019-06-30 MED ORDER — SUPREP BOWEL PREP KIT 17.5-3.13-1.6 GM/177ML PO SOLN: 1.0000 | Freq: Once | ORAL | 0 refills | Status: AC

## 2019-06-30 NOTE — Progress Notes (Signed)
No egg or soy allergy known to patient  No issues with past sedation with any surgeries  or procedures, no intubation problems  No diet pills per patient No home 02 use per patient  No blood thinners per patient  Pt denies issues with constipation  No A fib or A flutter  EMMI video sent to pt   Pt rushed me through her PV- she kept stating she had another call to make and she was putting away chicken- she kept saying I needed to hurry- encouraged her to call with any questions after she receives her packet in the mail    Pt verified name, DOB, address and insurance during PV today. Pt mailed instruction packet to included paper to complete and mail back to [facility] with addressed and stamped envelope, Emmi video, copy of consent form to read and not return, and instructions. Suprep $15  coupon mailed in packet. PV completed over the phone. Pt encouraged to call with questions or issues   Pt is aware that care partner will wait in the car during proceudre; if they feel like they will be too hot to wait in the car; they may wait in the lobby.  We want them to wear a mask (we do not have any that we can provide them), practice social distancing, and we will check their temperatures when they get here.  I did remind patient that their care partner needs to stay in the parking lot the entire time. Pt will wear mask into building.

## 2019-07-07 ENCOUNTER — Encounter: Payer: Self-pay | Admitting: Internal Medicine

## 2019-07-14 ENCOUNTER — Encounter: Payer: 59 | Admitting: Gastroenterology

## 2019-07-20 ENCOUNTER — Ambulatory Visit: Payer: 59

## 2019-08-09 ENCOUNTER — Telehealth: Payer: Self-pay | Admitting: Gastroenterology

## 2019-08-09 NOTE — Telephone Encounter (Signed)

## 2019-08-10 ENCOUNTER — Ambulatory Visit (AMBULATORY_SURGERY_CENTER): Payer: 59 | Admitting: Gastroenterology

## 2019-08-10 ENCOUNTER — Other Ambulatory Visit: Payer: Self-pay

## 2019-08-10 ENCOUNTER — Encounter: Payer: Self-pay | Admitting: Gastroenterology

## 2019-08-10 VITALS — BP 116/77 | HR 60 | Temp 99.1°F | Resp 27 | Ht 62.0 in | Wt 112.0 lb

## 2019-08-10 DIAGNOSIS — D123 Benign neoplasm of transverse colon: Secondary | ICD-10-CM | POA: Diagnosis not present

## 2019-08-10 DIAGNOSIS — Z86010 Personal history of colonic polyps: Secondary | ICD-10-CM

## 2019-08-10 DIAGNOSIS — D128 Benign neoplasm of rectum: Secondary | ICD-10-CM

## 2019-08-10 MED ORDER — SODIUM CHLORIDE 0.9 % IV SOLN: 500.0000 mL | Freq: Once | INTRAVENOUS | Status: DC

## 2019-08-10 NOTE — Progress Notes (Signed)
Pt's states no medical or surgical changes since previsit or office visit.  Vitals and temp per   CWPt's states no medical or surgical changes since previsit or office visit.

## 2019-08-10 NOTE — Op Note (Signed)
[facility] Patient Name: Martha Frank Procedure Date: 08/10/2019 2:36 PM MRN: [id number] Endoscopist: Mauri Pole , MD Age: 58 Referring MD:  Date of Birth: [birth year] Gender: Female Account #: [id number] Procedure:                Colonoscopy Indications:              High risk colon cancer surveillance: Personal                            history of colonic polyps, High risk colon cancer                            surveillance: Personal history of adenoma (10 mm or                            greater in size) Medicines:                Monitored Anesthesia Care Procedure:                Pre-Anesthesia Assessment:                           - Prior to the procedure, a History and Physical                            was performed, and patient medications and                            allergies were reviewed. The patient's tolerance of                            previous anesthesia was also reviewed. The risks                            and benefits of the procedure and the sedation                            options and risks were discussed with the patient.                            All questions were answered, and informed consent                            was obtained. Prior Anticoagulants: The patient has                            taken no previous anticoagulant or antiplatelet                            agents. ASA Grade Assessment: II - A patient with                            mild systemic disease. After reviewing the risks  and benefits, the patient was deemed in                            satisfactory condition to undergo the procedure.                           After obtaining informed consent, the colonoscope                            was passed under direct vision. Throughout the                            procedure, the patient's blood pressure, pulse, and                            oxygen saturations were monitored  continuously. The                            Colonoscope was introduced through the anus and                            advanced to the the cecum, identified by                            appendiceal orifice and ileocecal valve. The                            colonoscopy was performed without difficulty. The                            patient tolerated the procedure well. The quality                            of the bowel preparation was excellent. The                            ileocecal valve, appendiceal orifice, and rectum                            were photographed. Scope In: 2:42:50 PM Scope Out: 3:03:32 PM Scope Withdrawal Time: 0 hours 8 minutes 9 seconds  Total Procedure Duration: 0 hours 20 minutes 42 seconds  Findings:                 The perianal and digital rectal examinations were                            normal.                           Localized mild inflammation characterized by                            congestion (edema) and erythema was found at the  appendiceal orifice.                           Two sessile polyps were found in the rectum and                            transverse colon. The polyps were 4 to 8 mm in                            size. These polyps were removed with a cold snare.                            Resection and retrieval were complete.                           Non-bleeding internal hemorrhoids were found during                            retroflexion. The hemorrhoids were medium-sized.                           The exam was otherwise without abnormality. Complications:            No immediate complications. Estimated Blood Loss:     Estimated blood loss was minimal. Impression:               - Localized mild inflammation was found at the                            appendiceal orifice.                           - Two 4 to 8 mm polyps in the rectum and in the                            transverse colon, removed  with a cold snare.                            Resected and retrieved.                           - Non-bleeding internal hemorrhoids.                           - The examination was otherwise normal. Recommendation:           - Patient has a contact number available for                            emergencies. The signs and symptoms of potential                            delayed complications were discussed with the                            patient. Return to normal  activities tomorrow.                            Written discharge instructions were provided to the                            patient.                           - Resume previous diet.                           - Continue present medications.                           - Await pathology results.                           - Repeat colonoscopy in 5 years for surveillance                            based on pathology results. Mauri Pole, MD 08/10/2019 3:09:44 PM This report has been signed electronically.

## 2019-08-10 NOTE — Patient Instructions (Signed)
YOU HAD AN ENDOSCOPIC PROCEDURE TODAY AT [facility]:   Refer to the procedure report that was given to you for any specific questions about what was found during the examination.  If the procedure report does not answer your questions, please call your gastroenterologist to clarify.  If you requested that your care partner not be given the details of your procedure findings, then the procedure report has been included in a sealed envelope for you to review at your convenience later.  **Handouts given on polyps and hemorrhoids**  YOU SHOULD EXPECT: Some feelings of bloating in the abdomen. Passage of more gas than usual.  Walking can help get rid of the air that was put into your GI tract during the procedure and reduce the bloating. If you had a lower endoscopy (such as a colonoscopy or flexible sigmoidoscopy) you may notice spotting of blood in your stool or on the toilet paper. If you underwent a bowel prep for your procedure, you may not have a normal bowel movement for a few days.  Please Note:  You might notice some irritation and congestion in your nose or some drainage.  This is from the oxygen used during your procedure.  There is no need for concern and it should clear up in a day or so.  SYMPTOMS TO REPORT IMMEDIATELY:   Following lower endoscopy (colonoscopy or flexible sigmoidoscopy):  Excessive amounts of blood in the stool  Significant tenderness or worsening of abdominal pains  Swelling of the abdomen that is new, acute  Fever of 100F or higher   For urgent or emergent issues, a gastroenterologist can be reached at any hour by calling [phone].   DIET:  We do recommend a small meal at first, but then you may proceed to your regular diet.  Drink plenty of fluids but you should avoid alcoholic beverages for 24 hours.  ACTIVITY:  You should plan to take it easy for the rest of today and you should NOT DRIVE or use heavy machinery until tomorrow (because of  the sedation medicines used during the test).    FOLLOW UP: Our staff will call the number listed on your records 48-72 hours following your procedure to check on you and address any questions or concerns that you may have regarding the information given to you following your procedure. If we do not reach you, we will leave a message.  We will attempt to reach you two times.  During this call, we will ask if you have developed any symptoms of COVID 19. If you develop any symptoms (ie: fever, flu-like symptoms, shortness of breath, cough etc.) before then, please call [phone].  If you test positive for Covid 19 in the 2 weeks post procedure, please call and report this information to [place].    If any biopsies were taken you will be contacted by phone or by letter within the next 1-3 weeks.  Please call us at [phone] if you have not heard about the biopsies in 3 weeks.    SIGNATURES/CONFIDENTIALITY: You and/or your care partner have signed paperwork which will be entered into your electronic medical record.  These signatures attest to the fact that that the information above on your After Visit Summary has been reviewed and is understood.  Full responsibility of the confidentiality of this discharge information lies with you and/or your care-partner.

## 2019-08-10 NOTE — Progress Notes (Signed)
Called to room to assist during endoscopic procedure.  Patient ID and intended procedure confirmed with present staff. Received instructions for my participation in the procedure from the performing physician.  

## 2019-08-10 NOTE — Progress Notes (Signed)
A/ox3, pleased with MAC, report to RN 

## 2019-08-12 ENCOUNTER — Telehealth: Payer: Self-pay | Admitting: *Deleted

## 2019-08-12 NOTE — Telephone Encounter (Signed)
  Follow up Call-  Call back number 08/10/2019  Post procedure Call Back phone  # [phone]  Permission to leave phone message Yes  Some recent data might be hidden     Patient questions:  Do you have a fever, pain , or abdominal swelling? No. Pain Score  0 *  Have you tolerated food without any problems? Yes.    Have you been able to return to your normal activities? Yes.    Do you have any questions about your discharge instructions: Diet   No. Medications  No. Follow up visit  No.  Do you have questions or concerns about your Care? No.  Actions: * If pain score is 4 or above: No action needed, pain <4.  1. Have you developed a fever since your procedure? no  2.   Have you had an respiratory symptoms (SOB or cough) since your procedure? no  3.   Have you tested positive for COVID 19 since your procedure no  4.   Have you had any family members/close contacts diagnosed with the COVID 19 since your procedure?  no   If yes to any of these questions please route to Joylene John, RN and Alphonsa Gin, Therapist, sports.

## 2019-08-14 ENCOUNTER — Emergency Department (HOSPITAL_COMMUNITY)
Admission: EM | Admit: 2019-08-14 | Discharge: 2019-08-14 | Disposition: A | Discharge status: Home / Self Care | Payer: 59 | Attending: Emergency Medicine | Admitting: Emergency Medicine

## 2019-08-14 ENCOUNTER — Encounter (HOSPITAL_COMMUNITY): Payer: Self-pay | Admitting: Emergency Medicine

## 2019-08-14 ENCOUNTER — Other Ambulatory Visit: Payer: Self-pay

## 2019-08-14 ENCOUNTER — Emergency Department (HOSPITAL_COMMUNITY): Payer: 59

## 2019-08-14 DIAGNOSIS — Z79899 Other long term (current) drug therapy: Secondary | ICD-10-CM | POA: Insufficient documentation

## 2019-08-14 DIAGNOSIS — R110 Nausea: Secondary | ICD-10-CM | POA: Insufficient documentation

## 2019-08-14 DIAGNOSIS — R1031 Right lower quadrant pain: Secondary | ICD-10-CM | POA: Insufficient documentation

## 2019-08-14 DIAGNOSIS — Z87891 Personal history of nicotine dependence: Secondary | ICD-10-CM | POA: Insufficient documentation

## 2019-08-14 DIAGNOSIS — R197 Diarrhea, unspecified: Secondary | ICD-10-CM | POA: Diagnosis not present

## 2019-08-14 DIAGNOSIS — R1033 Periumbilical pain: Secondary | ICD-10-CM | POA: Insufficient documentation

## 2019-08-14 DIAGNOSIS — R109 Unspecified abdominal pain: Secondary | ICD-10-CM

## 2019-08-14 LAB — URINALYSIS, ROUTINE W REFLEX MICROSCOPIC
Bilirubin Urine: NEGATIVE
Glucose, UA: NEGATIVE mg/dL
Ketones, ur: NEGATIVE mg/dL
Leukocytes,Ua: NEGATIVE
Nitrite: NEGATIVE
Protein, ur: NEGATIVE mg/dL
Specific Gravity, Urine: 1.018 (ref 1.005–1.030)
pH: 5 (ref 5.0–8.0)

## 2019-08-14 LAB — CBC
HCT: 44.3 % (ref 36.0–46.0)
Hemoglobin: 14.7 g/dL (ref 12.0–15.0)
MCH: 31.6 pg (ref 26.0–34.0)
MCHC: 33.2 g/dL (ref 30.0–36.0)
MCV: 95.3 fL (ref 80.0–100.0)
Platelets: 291 10*3/uL (ref 150–400)
RBC: 4.65 MIL/uL (ref 3.87–5.11)
RDW: 11.3 % — ABNORMAL LOW (ref 11.5–15.5)
WBC: 6.2 10*3/uL (ref 4.0–10.5)
nRBC: 0 % (ref 0.0–0.2)

## 2019-08-14 LAB — PREGNANCY, URINE: Preg Test, Ur: NEGATIVE

## 2019-08-14 LAB — COMPREHENSIVE METABOLIC PANEL
ALT: 15 U/L (ref 0–44)
AST: 20 U/L (ref 15–41)
Albumin: 3.9 g/dL (ref 3.5–5.0)
Alkaline Phosphatase: 85 U/L (ref 38–126)
Anion gap: 11 (ref 5–15)
BUN: 11 mg/dL (ref 6–20)
CO2: 25 mmol/L (ref 22–32)
Calcium: 9.5 mg/dL (ref 8.9–10.3)
Chloride: 102 mmol/L (ref 98–111)
Creatinine, Ser: 0.65 mg/dL (ref 0.44–1.00)
GFR calc Af Amer: >60 mL/min (ref >60)
GFR calc non Af Amer: >60 mL/min (ref >60)
Glucose, Bld: 107 mg/dL — ABNORMAL HIGH (ref 70–99)
Potassium: 3.7 mmol/L (ref 3.5–5.1)
Sodium: 138 mmol/L (ref 135–145)
Total Bilirubin: 0.5 mg/dL (ref 0.3–1.2)
Total Protein: 7.5 g/dL (ref 6.5–8.1)

## 2019-08-14 LAB — LIPASE, BLOOD: Lipase: 37 U/L (ref 11–51)

## 2019-08-14 LAB — I-STAT BETA HCG BLOOD, ED (MC, WL, AP ONLY): I-stat hCG, quantitative: 9.1 m[IU]/mL — ABNORMAL HIGH (ref <5)

## 2019-08-14 MED ORDER — AMOXICILLIN-POT CLAVULANATE 875-125 MG PO TABS: 1.0000 | ORAL_TABLET | Freq: Three times a day (TID) | ORAL | 0 refills | Status: AC

## 2019-08-14 MED ORDER — ONDANSETRON HCL 4 MG PO TABS
4.0000 mg | ORAL_TABLET | Freq: Once | ORAL | Status: AC
  Administered 2019-08-14: 4 mg via ORAL
  Filled 2019-08-14: qty 1

## 2019-08-14 MED ORDER — SODIUM CHLORIDE 0.9% FLUSH: 3.0000 mL | Freq: Once | INTRAVENOUS | Status: DC

## 2019-08-14 MED ORDER — ONDANSETRON 4 MG PO TBDP: 4.0000 mg | ORAL_TABLET | Freq: Three times a day (TID) | ORAL | 0 refills | Status: AC | PRN

## 2019-08-14 MED ORDER — METRONIDAZOLE 500 MG PO TABS
500.0000 mg | ORAL_TABLET | Freq: Once | ORAL | Status: DC
  Filled 2019-08-14: qty 1

## 2019-08-14 MED ORDER — AMOXICILLIN-POT CLAVULANATE 875-125 MG PO TABS
1.0000 | ORAL_TABLET | Freq: Once | ORAL | Status: AC
  Administered 2019-08-14: 1 via ORAL
  Filled 2019-08-14: qty 1

## 2019-08-14 MED ORDER — IOHEXOL 300 MG/ML  SOLN
100.0000 mL | Freq: Once | INTRAMUSCULAR | Status: AC | PRN
  Administered 2019-08-14: 100 mL via INTRAVENOUS

## 2019-08-14 MED ORDER — METRONIDAZOLE 500 MG PO TABS: 500.0000 mg | ORAL_TABLET | Freq: Three times a day (TID) | ORAL | 0 refills | Status: DC

## 2019-08-14 MED ORDER — CIPROFLOXACIN HCL 500 MG PO TABS: 500.0000 mg | ORAL_TABLET | Freq: Two times a day (BID) | ORAL | 0 refills | Status: DC

## 2019-08-14 MED ORDER — CIPROFLOXACIN HCL 500 MG PO TABS
500.0000 mg | ORAL_TABLET | Freq: Once | ORAL | Status: DC
  Filled 2019-08-14: qty 1

## 2019-08-14 NOTE — ED Provider Notes (Signed)
[facility] EMERGENCY DEPARTMENT Provider Note   CSN: [id number] Arrival date & time: 08/14/19  1355    History   Chief Complaint Chief Complaint  Patient presents with   Flank Pain    right sided    HPI Martha Frank is a 58 y.o. female with past medical history of endometriosis colonic polyps presents emergency department today with chief complaint of right-sided abdominal pain x2 days.  Patient recently had a colonoscopy on 08/10/2019.  She does not think she has passed as much gas as she typically does after colonoscopy.  She reports she was told she had an inflamed appendix and to come to the emergency department if she had any worsening symptoms.  Patient states she became concerned when she developed abdominal pain.  She thinks the pain started around her umbilicus and now is in the right lower quadrant as well as right side.  States the pain can be both sharp and dull.  She reports associated nausea without emesis as well as anorexia.  Patient has had 2 episodes of loose stool in the last 2 days.  She states this is unusual for her because she is typically constipated and has hard stool.  There was no blood in her stool.  Last p.o. intake was dinner last night, approximately 24 hours ago.  Her abdominal surgical history includes laparoscopic surgery for endometriosis.  She denies any fever, chest pain, shortness of breath, cough, urinary symptoms.  History provided by patient with additional history obtained from chart review.      Past Medical History:  Diagnosis Date   Breast cyst    right breast/papilloma   Endometriosis    History of colonic polyps    TVA, TA    There are no active problems to display for this patient.   Past Surgical History:  Procedure Laterality Date   BREAST BIOPSY Left 06/05/2015   benign   BREAST BIOPSY Left 12/04/2006   BREAST EXCISIONAL BIOPSY Left 08/01/2015   benign   COLONOSCOPY     COSMETIC SURGERY      HIP ARTHROSCOPY     platelet rich plasmsa Left 07/2009   POLYPECTOMY     RADIOACTIVE SEED GUIDED EXCISIONAL BREAST BIOPSY Left 08/01/2015   Procedure: RADIOACTIVE SEED GUIDED EXCISIONAL LEFT BREAST BIOPSY;  Surgeon: Stark Klein, MD;  Location: [facility];  Service: General;  Laterality: Left;   thermal ablasion     TONSILLECTOMY       OB History   No obstetric history on file.      Home Medications    Prior to Admission medications   Medication Sig Start Date End Date Taking? Authorizing Provider  Cholecalciferol (VITAMIN D3) 50 MCG (2000 UT) capsule Take 4,000 Units by mouth daily.   Yes [provider]  HYDROcodone-acetaminophen (NORCO/VICODIN) 5-325 MG tablet Take 1 tablet by mouth every 6 (six) hours as needed (for pain).  08/04/19  Yes [provider]  ibuprofen (ADVIL) 200 MG tablet Take 200 mg by mouth every 6 (six) hours as needed for headache or mild pain.   Yes [provider]  mirabegron ER (MYRBETRIQ) 25 MG TB24 tablet Take 25 mg by mouth daily. Reported on 03/25/2016   Yes [provider]  amoxicillin-clavulanate (AUGMENTIN) 875-125 MG tablet Take 1 tablet by mouth every 8 (eight) hours for 10 days. 08/14/19 08/24/19  Corinna Burkman E, PA-C  ondansetron (ZOFRAN ODT) 4 MG disintegrating tablet Take 1 tablet (4 mg total) by  mouth every 8 (eight) hours as needed for nausea or vomiting. 08/14/19   Zuma Hust, Verline Lema E, PA-C  oxyCODONE-acetaminophen (ROXICET) 5-325 MG per tablet Take 1-2 tablets by mouth every 4 (four) hours as needed for severe pain. Patient not taking: Reported on 08/14/2019 08/01/15   Stark Klein, MD    Family History Family History  Problem Relation Age of Onset   Breast cancer Mother    Prostate cancer Father    Breast cancer Maternal Aunt    Colon polyps Neg Hx    Colon cancer Neg Hx    Esophageal cancer Neg Hx    Rectal cancer Neg Hx    Stomach cancer Neg Hx     Social History Social  History   Tobacco Use   Smoking status: Former Smoker    Types: Cigarettes    Quit date: 12/29/2013    Years since quitting: 5.6   Smokeless tobacco: Never Used  Substance Use Topics   Alcohol use: Yes    Alcohol/week: 14.0 standard drinks    Types: 14 Glasses of wine per week   Drug use: No     Allergies   Patient has no known allergies.   Review of Systems Review of Systems  Constitutional: Positive for appetite change. Negative for chills and fever.  HENT: Negative for congestion, ear discharge, ear pain, sinus pressure, sinus pain and sore throat.   Eyes: Negative for pain and redness.  Respiratory: Negative for cough and shortness of breath.   Cardiovascular: Negative for chest pain.  Gastrointestinal: Positive for abdominal pain, diarrhea and nausea. Negative for constipation and vomiting.  Genitourinary: Negative for dysuria and hematuria.  Musculoskeletal: Negative for back pain and neck pain.  Skin: Negative for wound.  Neurological: Negative for weakness, numbness and headaches.     Physical Exam Updated Vital Signs BP 137/72    Pulse 66    Temp 98.5 F (36.9 C)    Resp 16    Ht 5\' 2"  (1.575 m)    Wt 50.8 kg    SpO2 99%    BMI 20.49 kg/m   Physical Exam Vitals signs and nursing note reviewed.  Constitutional:      General: She is not in acute distress.    Appearance: She is not ill-appearing.  HENT:     Head: Normocephalic and atraumatic.     Right Ear: Tympanic membrane and external ear normal.     Left Ear: Tympanic membrane and external ear normal.     Nose: Nose normal.     Mouth/Throat:     Mouth: Mucous membranes are moist.     Pharynx: Oropharynx is clear.  Eyes:     General: No scleral icterus.       Right eye: No discharge.        Left eye: No discharge.     Extraocular Movements: Extraocular movements intact.     Conjunctiva/sclera: Conjunctivae normal.     Pupils: Pupils are equal, round, and reactive to light.  Neck:      Musculoskeletal: Normal range of motion.     Vascular: No JVD.  Cardiovascular:     Rate and Rhythm: Normal rate and regular rhythm.     Pulses: Normal pulses.          Radial pulses are 2+ on the right side and 2+ on the left side.     Heart sounds: Normal heart sounds.  Pulmonary:     Comments: Lungs clear to auscultation in all fields.  Symmetric chest rise. No wheezing, rales, or rhonchi. Abdominal:     General: Bowel sounds are normal.     Tenderness: There is right CVA tenderness. There is no left CVA tenderness.     Comments: Abdomen is soft, non-distended, tenderness to palpation of right lower quadrant with voluntary guarding.  No rigidity. No peritoneal signs.  Musculoskeletal: Normal range of motion.  Skin:    General: Skin is warm and dry.     Capillary Refill: Capillary refill takes less than 2 seconds.  Neurological:     Mental Status: She is oriented to person, place, and time.     GCS: GCS eye subscore is 4. GCS verbal subscore is 5. GCS motor subscore is 6.     Comments: Fluent speech, no facial droop.  Psychiatric:        Behavior: Behavior normal.      ED Treatments / Results  Labs (all labs ordered are listed, but only abnormal results are displayed) Labs Reviewed  COMPREHENSIVE METABOLIC PANEL - Abnormal; Notable for the following components:      Result Value   Glucose, Bld 107 (*)    All other components within normal limits  CBC - Abnormal; Notable for the following components:   RDW 11.3 (*)    All other components within normal limits  URINALYSIS, ROUTINE W REFLEX MICROSCOPIC - Abnormal; Notable for the following components:   Hgb urine dipstick SMALL (*)    Bacteria, UA RARE (*)    All other components within normal limits  I-STAT BETA HCG BLOOD, ED ([facility], [facility], [facility] ONLY) - Abnormal; Notable for the following components:   I-stat hCG, quantitative 9.1 (*)    All other components within normal limits  LIPASE, BLOOD  PREGNANCY, URINE     EKG None  Radiology Ct Abdomen Pelvis W Contrast  Result Date: 08/14/2019 CLINICAL DATA:  Right abdominal pain and low-grade fevers since a colonoscopy on 08/10/2019. The patient reports that she was told she had an inflamed appendix. Ex-smoker. EXAM: CT ABDOMEN AND PELVIS WITH CONTRAST TECHNIQUE: Multidetector CT imaging of the abdomen and pelvis was performed using the standard protocol following bolus administration of intravenous contrast. CONTRAST:  145mL OMNIPAQUE IOHEXOL 300 MG/ML  SOLN COMPARISON:  Colonoscopy report dated 08/10/2019. This describes localized mild inflammation characterized by congestion (edema) and erythema at the appendiceal orifice. FINDINGS: Lower chest: Mild diffuse peribronchial thickening. Minimal linear scarring at the left lung base. Normal sized heart. Hepatobiliary: Diffuse low density of the liver relative to the spleen. 1.4 cm inferior right lobe low density, oval liver mass with peripheral contrast puddling. Normal appearing gallbladder. Pancreas: Unremarkable. No pancreatic ductal dilatation or surrounding inflammatory changes. Spleen: Normal in size without focal abnormality. Adrenals/Urinary Tract: Adrenal glands are unremarkable. Kidneys are normal, without renal calculi, focal lesion, or hydronephrosis. Bladder is unremarkable. Stomach/Bowel: There is either a dilated proximal appendix or small inferior extension of the cecum with diffuse low density wall thickening and mucosal enhancement. This is located in the right lower abdomen/upper pelvis and measures 18 mm in maximum diameter with a maximum wall thickness of 7 mm. More distally, there is a small appendix or small portion of the appendix which has a normal appearance, measuring 5 mm in maximum diameter. No surrounding soft tissue stranding is seen. No fluid collections are demonstrated. Unremarkable stomach and small bowel. Vascular/Lymphatic: Minimal atheromatous arterial calcifications without  aneurysm. No enlarged lymph nodes. Reproductive: Uterus and bilateral adnexa are unremarkable. Other: No abdominal wall hernia or  abnormality. No abdominopelvic ascites. Musculoskeletal: Mild lumbar and lower thoracic spine degenerative changes. Approximately 20% L1 vertebral superior endplate compression deformity with mild Schmorl's node formation. No acute fracture lines or bony retropulsion. IMPRESSION: 1. Possible dilated proximal appendix or small inferior extension of the cecum with diffuse low density wall thickening and mucosal enhancement. This is compatible with focal inflammation involving the inferior cecum or proximal appendix. This would be an unusual appearance for appendicitis since the distal appendix has a normal appearance and there is no adjacent soft tissue stranding. 2. Diffuse hepatic steatosis. 3. 1.4 cm hemangioma in the inferior right lobe of the liver. 4. Mild changes of chronic bronchitis. 5. Approximately 20% old L1 vertebral superior endplate compression deformity with mild Schmorl's node formation. Electronically Signed   By: Claudie Revering M.D.   On: 08/14/2019 20:40    Procedures Procedures (including critical care time)  Medications Ordered in ED Medications  sodium chloride flush (NS) 0.9 % injection 3 mL (3 mLs Intravenous Not Given 08/14/19 2303)  iohexol (OMNIPAQUE) 300 MG/ML solution 100 mL (100 mLs Intravenous Contrast Given 08/14/19 1938)  ondansetron (ZOFRAN) tablet 4 mg (4 mg Oral Given 08/14/19 2302)  amoxicillin-clavulanate (AUGMENTIN) 875-125 MG per tablet 1 tablet (1 tablet Oral Given 08/14/19 2302)     Initial Impression / Assessment and Plan / ED Course  I have reviewed the triage vital signs and the nursing notes.  Pertinent labs & imaging results that were available during my care of the patient were reviewed by me and considered in my medical decision making (see chart for details).  Patient presents to the ED with complaints of abdominal pain.  Patient nontoxic appearing, in no apparent distress, vitals WNL. On exam patient tender to right lower quadrant. no peritoneal signs. Will evaluate with labs and CT abdomen pelvis.  Patient declines analgesics and anti-emetics.  Labs reviewed and grossly unremarkable. No leukocytosis, no anemia, no significant electrolyte derangements. LFTs, renal function, and lipase WNL.  Beta-hCG is elevated.  Urine pregnancy test is negative.  Urinalysis without obvious infection. CT A/P shows Possible dilated proximal appendix or small inferior extension of the cecum with diffuse low density wall thickening and mucosal enhancement.  This is not typical appendicitis findings. Discussed with on-call surgeon Dr. Georgette Dover.  This is not a typical presentation for appendicitis as distal appendix has a normal appearance and there is no adjacent soft tissue stranding.  Given patient has no leukocytosis and is afebrile Dr. Georgette Dover recommends treatment with Cipro and Flagyl for possible colitis.  She will need to follow-up with GI within the week. Pt reports history of tendon rupture, so will treat with Augmentin. On repeat exam abdominal pain has improved and she is tolerating PO intake with sandwich and soda. Pt is stable to be discharged home with GI follow up. Strict return precautions discussed including worsening pain, fever, intractable vomiting. She has no further questions and agrees with plan. Stable to discharge home. Findings and plan of care discussed with supervising physician Dr. Regenia Skeeter.  This note was prepared using Dragon voice recognition software and may include unintentional dictation errors due to the inherent limitations of voice recognition software.    Final Clinical Impressions(s) / ED Diagnoses   Final diagnoses:  Abdominal pain, unspecified abdominal location    ED Discharge Orders         Ordered    ciprofloxacin (CIPRO) 500 MG tablet  2 times daily,   Status:  Discontinued     08/14/19 2221  metroNIDAZOLE (FLAGYL) 500 MG tablet  3 times daily,   Status:  Discontinued     08/14/19 2221    ondansetron (ZOFRAN ODT) 4 MG disintegrating tablet  Every 8 hours PRN     08/14/19 2221    amoxicillin-clavulanate (AUGMENTIN) 875-125 MG tablet  Every 8 hours     08/14/19 2248           Aloys Hupfer, Harley Hallmark, PA-C 08/15/19 0205    Sherwood Gambler, MD 08/17/19 [phone]

## 2019-08-14 NOTE — ED Triage Notes (Signed)
Pt reports a colonoscopy done on Wednesday 08/10/19 and since then she has been having low grade fevers and continued right sided pain. Pt reports they told her she had an enflamed appendix.

## 2019-08-14 NOTE — Discharge Instructions (Addendum)
You have been seen today for abdominal pain. Please read and follow all provided instructions. Return to the emergency room for worsening condition or new concerning symptoms.    If you have worsening abdominal pain, fever, or nausea you cannot control please return to the emergency department for further evaluation.  1. Medications:  Prescription has been sent to your pharmacy for Augmentin.  This is an antibiotic.  Please take as prescribed. Prescription also sent for Zofran.  This is for nausea.  Please take as prescribed and if needed. Continue usual home medications Take medications as prescribed. Please review all of the medicines and only take them if you do not have an allergy to them.   2. Treatment: rest, drink plenty of fluids  3. Follow Up: Please follow up with your GI doctor within 1 week for further evaluation.  It is also a possibility that you have an allergic reaction to any of the medicines that you have been prescribed - Everybody reacts differently to medications and while MOST people have no trouble with most medicines, you may have a reaction such as nausea, vomiting, rash, swelling, shortness of breath. If this is the case, please stop taking the medicine immediately and contact your physician.  ?

## 2019-08-14 NOTE — ED Notes (Signed)
Updated pt on wait for treatment room.  Gave pt a urine cup and informed her we needle a specimen.  No changes at this time.

## 2019-08-14 NOTE — ED Notes (Signed)
All appropriate discharge materials reviewed at length with patient. Time for questions provided. Pt has no other questions at this time and verbalizes understanding of all provided materials.  

## 2019-08-15 ENCOUNTER — Telehealth: Payer: Self-pay | Admitting: Gastroenterology

## 2019-08-15 NOTE — Telephone Encounter (Signed)
FYI- Spoke with the patient who has not had an OV yet to establish care. Reports she went to the ED for evaluation due to continued abd pain on 8/16. Completed blood work and CT which she wants Dr. Silverio Decamp to review. The patient was told to follow up with GI in 1 week and was upset that the earliest available appointment to be seen was 9/15 at 9:50 pm. She voiced concerns that her continued abd pain was considered emergent and her desire to have an earlier appointment should be granted. This RN explained to the patient that in true emergency situations, it is the recommendation of the provider to send the patient to the ED for evaluation as very little could be done in the office with such a complaint. Furthermore, we could not create a time slot for her if patient's were already scheduled. The patient continued to be frustrated but kept her appt. She heavily expressed that wanted Dr. Silverio Decamp to review her blood work and CT results obtained in the ED.

## 2019-08-16 ENCOUNTER — Telehealth: Payer: Self-pay | Admitting: Gastroenterology

## 2019-08-16 ENCOUNTER — Other Ambulatory Visit: Payer: Self-pay | Admitting: *Deleted

## 2019-08-16 DIAGNOSIS — Z86010 Personal history of colonic polyps: Secondary | ICD-10-CM

## 2019-08-16 MED ORDER — METRONIDAZOLE 500 MG PO TABS: 500.0000 mg | ORAL_TABLET | Freq: Two times a day (BID) | ORAL | 0 refills | Status: DC

## 2019-08-16 MED ORDER — CIPROFLOXACIN HCL 500 MG PO TABS: 500.0000 mg | ORAL_TABLET | Freq: Two times a day (BID) | ORAL | 0 refills | Status: DC

## 2019-08-16 NOTE — Telephone Encounter (Signed)
Sent prescriptions to pharmacy. Patient notified of prescriptions sent. Patient received another call, placed this RN on hold for several minutes without returning to the call.

## 2019-08-16 NOTE — Telephone Encounter (Signed)
Reviewed CT and labs. He had mild inflammation periappendiceal on colonoscopy and was also noted on the CT. given her symptoms of low-grade fever and abdominal pain, recommend starting Cipro 500 mg twice daily and Flagyl 500 mg twice daily for 5 days.  Please bring her in sooner with app. I called patient to discuss the management plan, unable to reach left a message for her to call back. Please call patient to give her the recommendation.  Thank you

## 2019-08-16 NOTE — Telephone Encounter (Signed)
Spoke to the patient, offered the patient an alternative appt with APP, the patient declined an appointment with the APP, she wanted to be specifically seen by Dr. Silverio Decamp. Called the pharmacy, discontinued Cipro and Flagyl prescriptions as per Dr. Woodward Ku orders, the patient is already currently taking Augmentin. CBC and CMP orders placed in Epic. Patient aware of need for labs the beginning of next week. Patient aware of hours of operation and location of lab. Soft diet reiterated to the patient. Patient verbalized understanding. No additional questions or concerns voiced by the conclusion of the phone call.

## 2019-08-16 NOTE — Telephone Encounter (Signed)
Thank you :)

## 2019-08-16 NOTE — Telephone Encounter (Signed)
Returned phone call, spoke to patient briefly, prior to being placed on hold for several minutes, this RN disconnected the line. Will try again later.

## 2019-08-22 ENCOUNTER — Encounter: Payer: Self-pay | Admitting: Gastroenterology

## 2019-08-23 ENCOUNTER — Other Ambulatory Visit (INDEPENDENT_AMBULATORY_CARE_PROVIDER_SITE_OTHER): Payer: 59

## 2019-08-23 DIAGNOSIS — Z86010 Personal history of colonic polyps: Secondary | ICD-10-CM | POA: Diagnosis not present

## 2019-08-23 LAB — CBC WITH DIFFERENTIAL/PLATELET
Basophils Absolute: 0 10*3/uL (ref 0.0–0.1)
Basophils Relative: 0.8 % (ref 0.0–3.0)
Eosinophils Absolute: 0.1 10*3/uL (ref 0.0–0.7)
Eosinophils Relative: 1.7 % (ref 0.0–5.0)
HCT: 40.4 % (ref 36.0–46.0)
Hemoglobin: 13.8 g/dL (ref 12.0–15.0)
Lymphocytes Relative: 37.2 % (ref 12.0–46.0)
Lymphs Abs: 2.1 10*3/uL (ref 0.7–4.0)
MCHC: 34.1 g/dL (ref 30.0–36.0)
MCV: 93.3 fl (ref 78.0–100.0)
Monocytes Absolute: 0.3 10*3/uL (ref 0.1–1.0)
Monocytes Relative: 5.8 % (ref 3.0–12.0)
Neutro Abs: 3.1 10*3/uL (ref 1.4–7.7)
Neutrophils Relative %: 54.5 % (ref 43.0–77.0)
Platelets: 251 10*3/uL (ref 150.0–400.0)
RBC: 4.33 Mil/uL (ref 3.87–5.11)
RDW: 12.2 % (ref 11.5–15.5)
WBC: 5.7 10*3/uL (ref 4.0–10.5)

## 2019-08-23 LAB — COMPREHENSIVE METABOLIC PANEL
ALT: 9 U/L (ref 0–35)
AST: 17 U/L (ref 0–37)
Albumin: 4.3 g/dL (ref 3.5–5.2)
Alkaline Phosphatase: 76 U/L (ref 39–117)
BUN: 15 mg/dL (ref 6–23)
CO2: 29 mEq/L (ref 19–32)
Calcium: 9.3 mg/dL (ref 8.4–10.5)
Chloride: 102 mEq/L (ref 96–112)
Creatinine, Ser: 0.64 mg/dL (ref 0.40–1.20)
GFR: 96.05 mL/min (ref >60.00)
Glucose, Bld: 145 mg/dL — ABNORMAL HIGH (ref 70–99)
Potassium: 4.5 mEq/L (ref 3.5–5.1)
Sodium: 137 mEq/L (ref 135–145)
Total Bilirubin: 0.4 mg/dL (ref 0.2–1.2)
Total Protein: 7 g/dL (ref 6.0–8.3)

## 2019-09-02 ENCOUNTER — Other Ambulatory Visit: Payer: Self-pay

## 2019-09-02 ENCOUNTER — Ambulatory Visit
Admission: RE | Admit: 2019-09-02 | Discharge: 2019-09-02 | Disposition: A | Discharge status: Home / Self Care | Payer: 59 | Source: Ambulatory Visit | Attending: Obstetrics | Admitting: Obstetrics

## 2019-09-02 DIAGNOSIS — Z1231 Encounter for screening mammogram for malignant neoplasm of breast: Secondary | ICD-10-CM

## 2019-09-13 ENCOUNTER — Ambulatory Visit: Payer: 59 | Admitting: Gastroenterology

## 2020-11-30 ENCOUNTER — Other Ambulatory Visit: Payer: Self-pay | Admitting: Obstetrics

## 2020-11-30 DIAGNOSIS — Z1231 Encounter for screening mammogram for malignant neoplasm of breast: Secondary | ICD-10-CM

## 2021-01-11 ENCOUNTER — Ambulatory Visit: Payer: 59

## 2021-02-21 ENCOUNTER — Ambulatory Visit: Payer: 59

## 2021-04-08 ENCOUNTER — Other Ambulatory Visit: Payer: Self-pay

## 2021-04-08 ENCOUNTER — Ambulatory Visit
Admission: RE | Admit: 2021-04-08 | Discharge: 2021-04-08 | Disposition: A | Discharge status: Home / Self Care | Payer: 59 | Source: Ambulatory Visit | Attending: Obstetrics | Admitting: Obstetrics

## 2021-04-08 DIAGNOSIS — Z1231 Encounter for screening mammogram for malignant neoplasm of breast: Secondary | ICD-10-CM

## 2022-03-17 ENCOUNTER — Other Ambulatory Visit: Payer: Self-pay | Admitting: Obstetrics

## 2022-03-17 DIAGNOSIS — Z1231 Encounter for screening mammogram for malignant neoplasm of breast: Secondary | ICD-10-CM

## 2022-04-09 ENCOUNTER — Ambulatory Visit: Payer: 59

## 2022-04-10 ENCOUNTER — Ambulatory Visit
Admission: RE | Admit: 2022-04-10 | Discharge: 2022-04-10 | Disposition: A | Discharge status: Home / Self Care | Payer: 59 | Source: Ambulatory Visit | Attending: Obstetrics | Admitting: Obstetrics

## 2022-04-10 DIAGNOSIS — Z1231 Encounter for screening mammogram for malignant neoplasm of breast: Secondary | ICD-10-CM

## 2023-03-29 IMAGING — MG MM DIGITAL SCREENING BILAT W/ TOMO AND CAD
8 series · 9 of 24 positions shown · non-contrast
Comparison: Previous exam(s).

CLINICAL DATA: Screening.

EXAM:
DIGITAL SCREENING BILATERAL MAMMOGRAM WITH TOMOSYNTHESIS AND CAD
TECHNIQUE: Bilateral screening digital craniocaudal and mediolateral oblique
mammograms were obtained. Bilateral screening digital breast
tomosynthesis was performed. The images were evaluated with
computer-aided detection.

[R CC synth-2D]
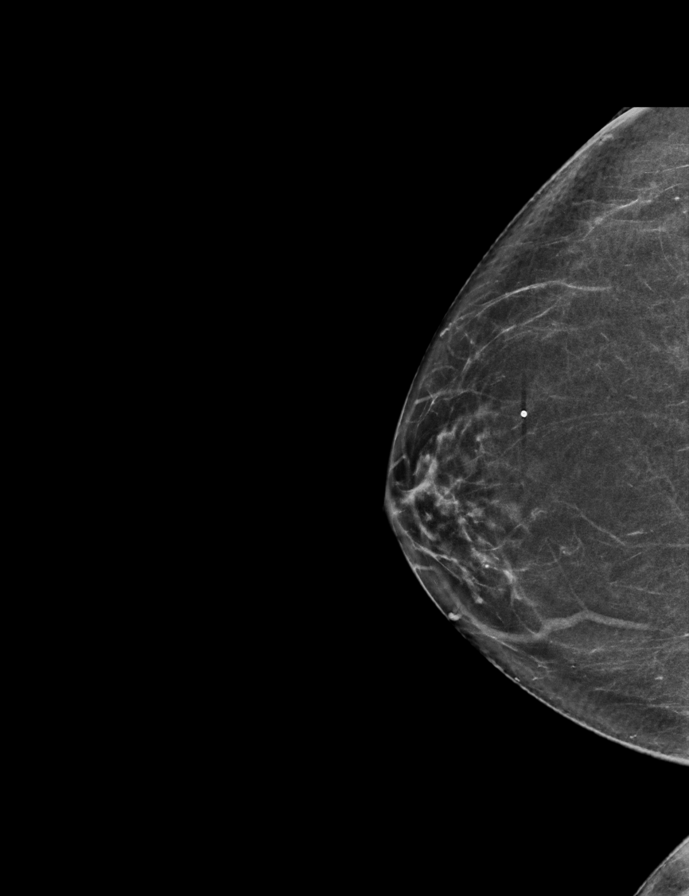

[R MLO synth-2D]
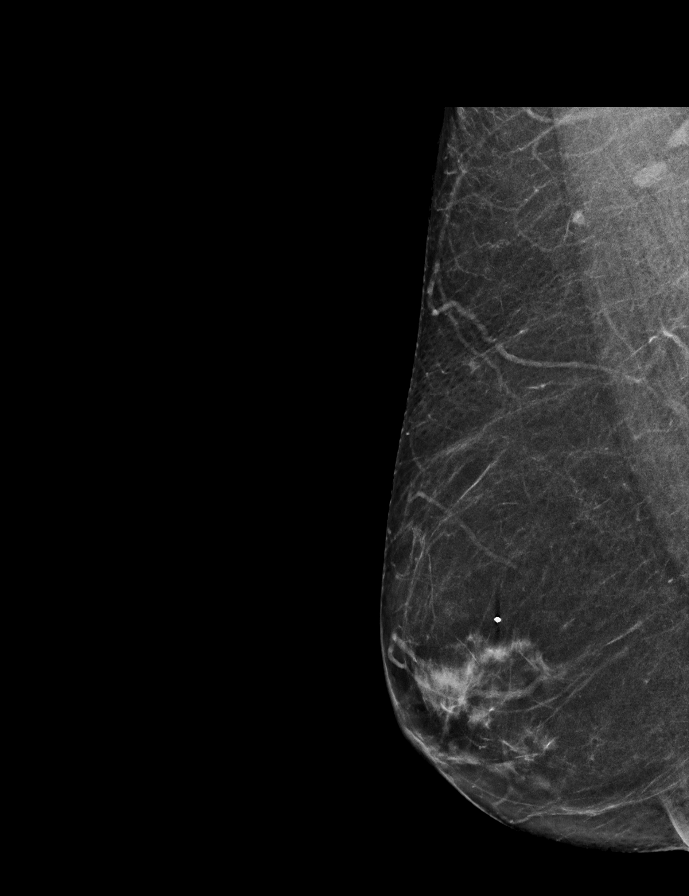

[L MLO synth-2D]
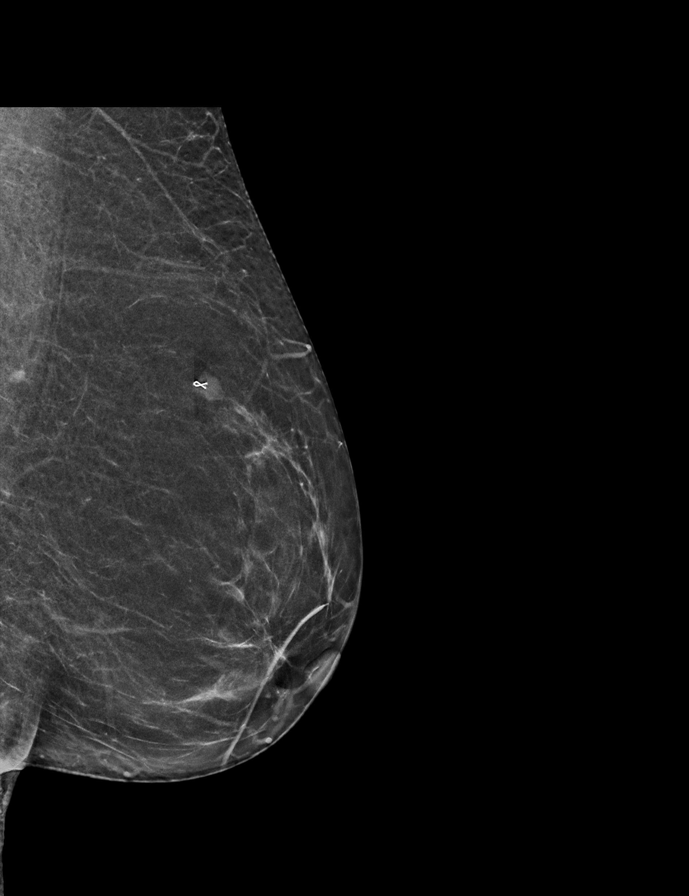

[L CC synth-2D]
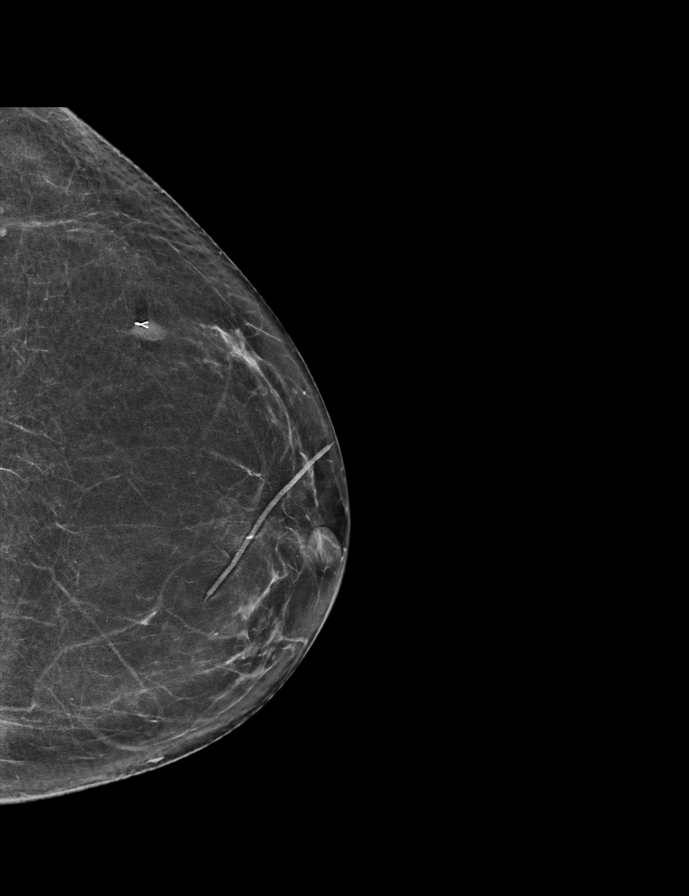

[L CC tomo · 2 of 59 frames shown]
[frame 20/59]
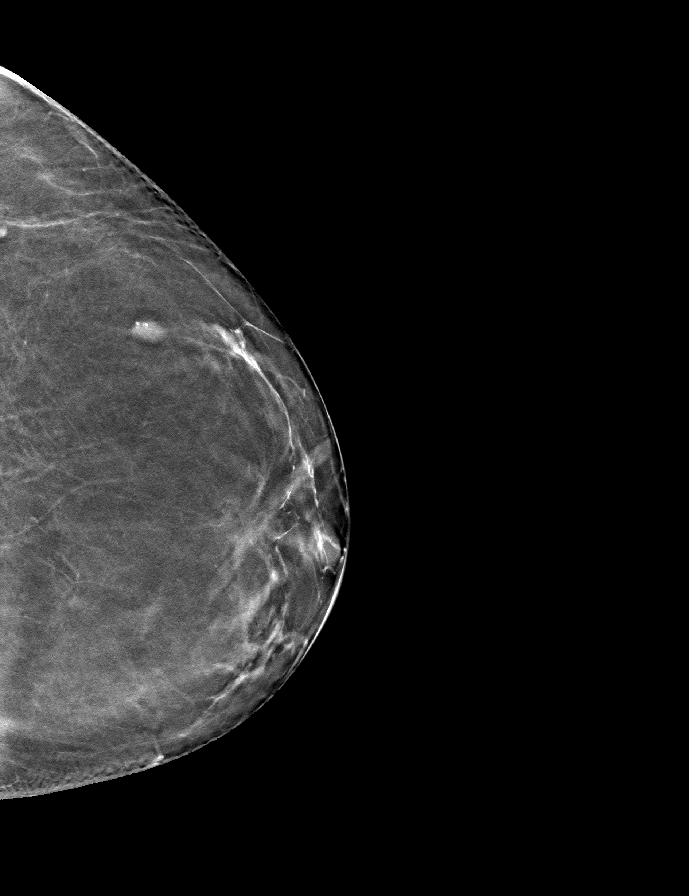
[frame 30/59]
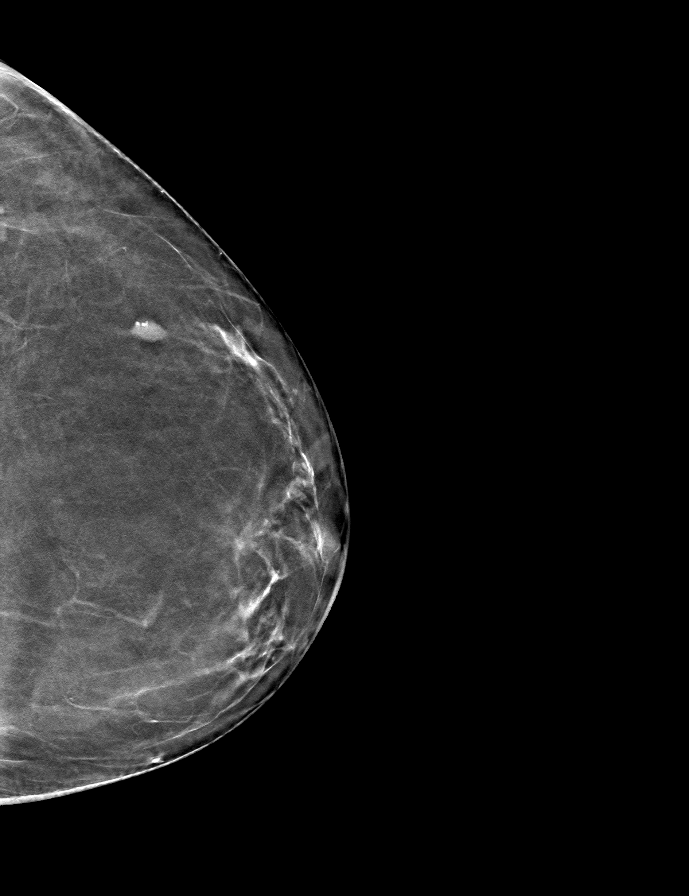

[L MLO tomo · tomo slice 29/56.0]
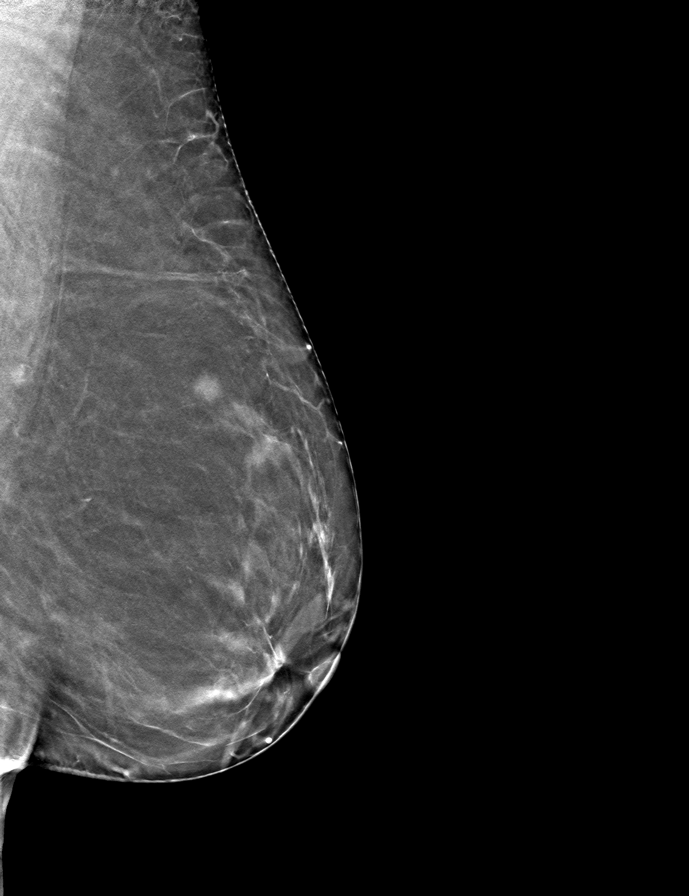

[R MLO tomo · tomo slice 29/56.0]
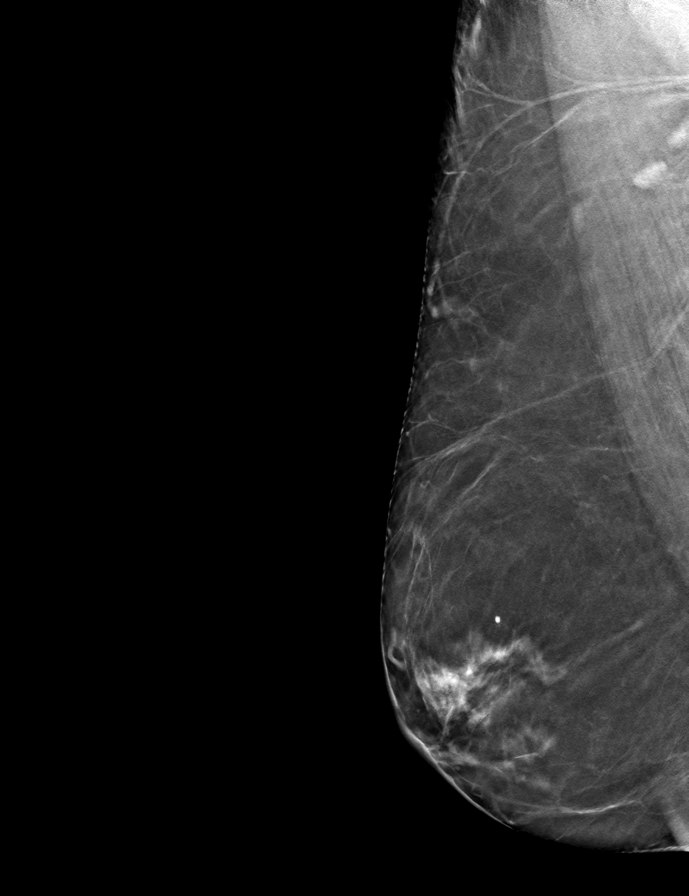

[R CC tomo · tomo slice 30/59.0]
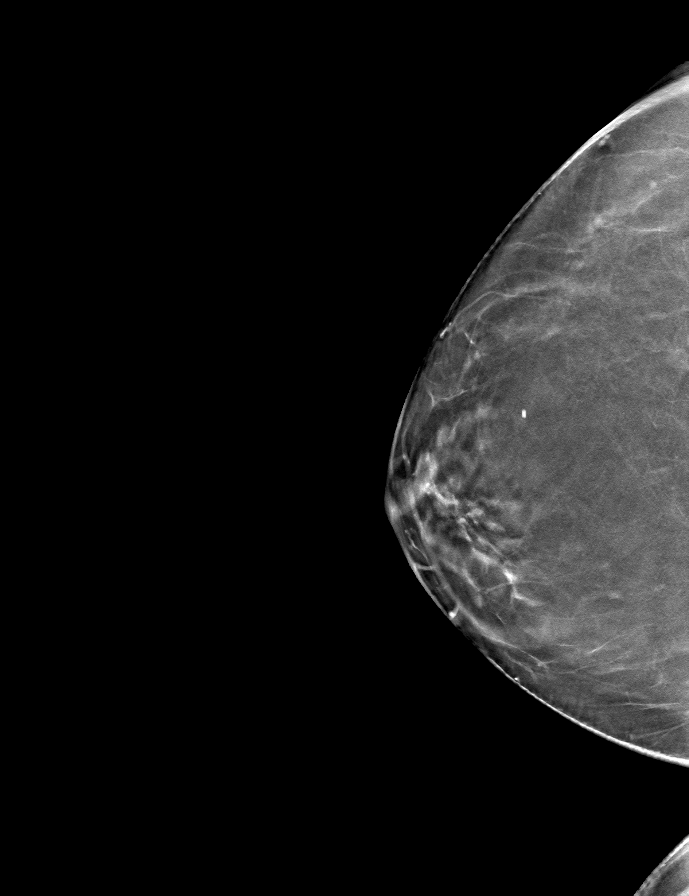

[9 of 24 positions shown; findings below may reference images not displayed]

ACR Breast Density Category b: There are scattered areas of
fibroglandular density.
FINDINGS: There are no findings suspicious for malignancy. The images were
evaluated with computer-aided detection.
IMPRESSION: No mammographic evidence of malignancy. A result letter of this
screening mammogram will be mailed directly to the patient.

RECOMMENDATION:
Screening mammogram in one year. (Code:[id number])

BI-RADS CATEGORY  1: Negative.

## 2023-05-12 ENCOUNTER — Other Ambulatory Visit: Payer: Self-pay

## 2023-05-12 DIAGNOSIS — Z1231 Encounter for screening mammogram for malignant neoplasm of breast: Secondary | ICD-10-CM

## 2023-05-28 ENCOUNTER — Ambulatory Visit
Admission: RE | Admit: 2023-05-28 | Discharge: 2023-05-28 | Disposition: A | Discharge status: Home / Self Care | Payer: 59 | Source: Ambulatory Visit | Attending: Obstetrics | Admitting: Obstetrics

## 2023-05-28 DIAGNOSIS — Z1231 Encounter for screening mammogram for malignant neoplasm of breast: Secondary | ICD-10-CM

## 2023-07-08 ENCOUNTER — Other Ambulatory Visit: Payer: Self-pay | Admitting: Obstetrics

## 2023-07-08 DIAGNOSIS — Z8781 Personal history of (healed) traumatic fracture: Secondary | ICD-10-CM

## 2024-04-06 ENCOUNTER — Ambulatory Visit
Admission: RE | Admit: 2024-04-06 | Discharge: 2024-04-06 | Disposition: A | Discharge status: Home / Self Care | Payer: 59 | Source: Ambulatory Visit | Attending: Obstetrics | Admitting: Obstetrics

## 2024-04-06 DIAGNOSIS — Z8781 Personal history of (healed) traumatic fracture: Secondary | ICD-10-CM

## 2024-07-25 ENCOUNTER — Other Ambulatory Visit: Payer: Self-pay | Admitting: Obstetrics

## 2024-07-25 DIAGNOSIS — Z1231 Encounter for screening mammogram for malignant neoplasm of breast: Secondary | ICD-10-CM

## 2024-07-27 ENCOUNTER — Inpatient Hospital Stay
Admission: RE | Admit: 2024-07-27 | Discharge: 2024-07-27 | Discharge status: Home / Self Care | Source: Ambulatory Visit | Attending: Obstetrics | Admitting: Obstetrics

## 2024-07-27 DIAGNOSIS — Z1231 Encounter for screening mammogram for malignant neoplasm of breast: Secondary | ICD-10-CM

## 2024-12-07 ENCOUNTER — Encounter: Payer: Self-pay | Admitting: Gastroenterology

## 2025-01-04 ENCOUNTER — Ambulatory Visit

## 2025-01-04 VITALS — Ht 62.0 in | Wt 116.0 lb

## 2025-01-04 DIAGNOSIS — Z860100 Personal history of colon polyps, unspecified: Secondary | ICD-10-CM

## 2025-01-04 MED ORDER — NA SULFATE-K SULFATE-MG SULF 17.5-3.13-1.6 GM/177ML PO SOLN: 1.0000 | Freq: Once | ORAL | 0 refills | Status: AC

## 2025-01-04 NOTE — Progress Notes (Signed)
 RN confirmed patient name, date of birth, and address RN confirmed date and time of procedure RN reviewed and confirmed allergies  RN reviewed and updated current medications; confirmed preferred pharmacy Pt is not on diet pills nor GLP-1 medications Pt is not on blood thinners RN reviewed medical & surgical hx  Pt states issues with chronic constipation (bms 2/week); does not do anything to help alleviate  Diabetic - no No A fib or A flutter No cardiac tests are pending  Pt is not on home 02  No issues known with past sedation with any surgeries or procedures Patient denies ever being told they had issues or difficulty with intubation  Patient unaware of any fh of malignant hyperthermia Ambulates independently RN reviewed prep instructions and explained time frames for holding certain medications RN answered patient questions; patient stated understanding Prep instructions sent

## 2025-01-09 ENCOUNTER — Encounter: Payer: Self-pay | Admitting: Gastroenterology

## 2025-01-18 ENCOUNTER — Encounter: Payer: Self-pay | Admitting: Gastroenterology

## 2025-01-18 ENCOUNTER — Ambulatory Visit: Admitting: Gastroenterology

## 2025-01-18 VITALS — BP 102/54 | HR 71 | Temp 97.8°F | Resp 18 | Ht 62.0 in | Wt 116.0 lb

## 2025-01-18 DIAGNOSIS — D123 Benign neoplasm of transverse colon: Secondary | ICD-10-CM

## 2025-01-18 DIAGNOSIS — Z860101 Personal history of adenomatous and serrated colon polyps: Secondary | ICD-10-CM | POA: Diagnosis not present

## 2025-01-18 DIAGNOSIS — K648 Other hemorrhoids: Secondary | ICD-10-CM

## 2025-01-18 DIAGNOSIS — D120 Benign neoplasm of cecum: Secondary | ICD-10-CM

## 2025-01-18 DIAGNOSIS — K644 Residual hemorrhoidal skin tags: Secondary | ICD-10-CM

## 2025-01-18 DIAGNOSIS — Z1211 Encounter for screening for malignant neoplasm of colon: Secondary | ICD-10-CM | POA: Diagnosis present

## 2025-01-18 DIAGNOSIS — Z860100 Personal history of colon polyps, unspecified: Secondary | ICD-10-CM

## 2025-01-18 MED ORDER — SODIUM CHLORIDE 0.9 % IV SOLN: 500.0000 mL | Freq: Once | INTRAVENOUS | Status: DC

## 2025-01-18 NOTE — Progress Notes (Unsigned)
 Called to room to assist during endoscopic procedure.  Patient ID and intended procedure confirmed with present staff. Received instructions for my participation in the procedure from the performing physician.

## 2025-01-18 NOTE — Progress Notes (Unsigned)
 [facility] History and Physical   Primary Care Physician:  Kandyce Sor, MD   Reason for Procedure:  History of adenomatous colon polyps  Plan:    Surveillance colonoscopy with possible interventions as needed     HPI: Martha Frank is a very pleasant 64 y.o. female here for surveillance colonoscopy. Denies any nausea, vomiting, abdominal pain, melena or bright red blood per rectum  The risks and benefits as well as alternatives of endoscopic procedure(s) have been discussed and reviewed.  The patient was provided an opportunity to ask questions and all were answered. The patient agreed with the plan and demonstrated an understanding of the instructions.   Past Medical History:  Diagnosis Date   Allergy    Breast cyst    right breast/papilloma   Endometriosis    GERD (gastroesophageal reflux disease)    History of colonic polyps    TVA, TA    Past Surgical History:  Procedure Laterality Date   BREAST BIOPSY Left 06/05/2015   benign   BREAST BIOPSY Left 12/04/2006   BREAST EXCISIONAL BIOPSY Left 08/01/2015   benign   COLONOSCOPY     COSMETIC SURGERY     HIP ARTHROSCOPY     platelet rich plasmsa Left 07/2009   POLYPECTOMY     RADIOACTIVE SEED GUIDED EXCISIONAL BREAST BIOPSY Left 08/01/2015   Procedure: RADIOACTIVE SEED GUIDED EXCISIONAL LEFT BREAST BIOPSY;  Surgeon: Jina Nephew, MD;  Location: [facility];  Service: General;  Laterality: Left;   thermal ablasion     TONSILLECTOMY      Prior to Admission medications  Medication Sig Start Date End Date Taking? Authorizing Provider  alendronate (FOSAMAX) 70 MG tablet Take 70 mg by mouth once a week. 04/13/24  Yes [provider]  Cholecalciferol (VITAMIN D3) 50 MCG (2000 UT) capsule Take 4,000 Units by mouth daily.   Yes [provider]  Vibegron (GEMTESA) 75 MG TABS Take 1 tablet by mouth daily. 07/08/23  Yes [provider]  Vitamin D, Ergocalciferol, (DRISDOL)  1.25 MG (50000 UNIT) CAPS capsule Take 50,000 Units by mouth every 7 (seven) days. 08/11/21  Yes [provider]  HYDROcodone-acetaminophen  (NORCO/VICODIN) 5-325 MG tablet Take 1 tablet by mouth every 6 (six) hours as needed (for pain).  Patient not taking: Reported on 01/04/2025 08/04/19   [provider]  ondansetron  (ZOFRAN  ODT) 4 MG disintegrating tablet Take 1 tablet (4 mg total) by mouth every 8 (eight) hours as needed for nausea or vomiting. Patient not taking: No sig reported 08/14/19   Walisiewicz, Kaitlyn E, PA-C  oxyCODONE -acetaminophen  (ROXICET) 5-325 MG per tablet Take 1-2 tablets by mouth every 4 (four) hours as needed for severe pain. Patient not taking: Reported on 01/04/2025 08/01/15   Nephew Jina, MD  triamcinolone (KENALOG) 0.025 % cream Apply topically daily as needed. to affected area 09/14/24   [provider]    Current Outpatient Medications  Medication Sig Dispense Refill   alendronate (FOSAMAX) 70 MG tablet Take 70 mg by mouth once a week.     Cholecalciferol (VITAMIN D3) 50 MCG (2000 UT) capsule Take 4,000 Units by mouth daily.     Vibegron (GEMTESA) 75 MG TABS Take 1 tablet by mouth daily.     Vitamin D, Ergocalciferol, (DRISDOL) 1.25 MG (50000 UNIT) CAPS capsule Take 50,000 Units by mouth every 7 (seven) days.     HYDROcodone-acetaminophen  (NORCO/VICODIN) 5-325 MG tablet Take 1 tablet by mouth every 6 (six) hours as needed (for pain).  (  Patient not taking: Reported on 01/04/2025)     ondansetron  (ZOFRAN  ODT) 4 MG disintegrating tablet Take 1 tablet (4 mg total) by mouth every 8 (eight) hours as needed for nausea or vomiting. (Patient not taking: No sig reported) 6 tablet 0   oxyCODONE -acetaminophen  (ROXICET) 5-325 MG per tablet Take 1-2 tablets by mouth every 4 (four) hours as needed for severe pain. (Patient not taking: Reported on 01/04/2025) 30 tablet 0   triamcinolone (KENALOG) 0.025 % cream Apply topically daily as needed. to affected area      Current Facility-Administered Medications  Medication Dose Route Frequency Provider Last Rate Last Admin   0.9 %  sodium chloride  infusion  500 mL Intravenous Once Ethon Wymer V, MD        Allergies as of 01/18/2025   (No Known Allergies)    Family History  Problem Relation Age of Onset   Breast cancer Mother    Prostate cancer Father    Breast cancer Maternal Aunt    Colon polyps Neg Hx    Colon cancer Neg Hx    Esophageal cancer Neg Hx    Rectal cancer Neg Hx    Stomach cancer Neg Hx     Social History   Socioeconomic History   Marital status: Married    Spouse name: Not on file   Number of children: Not on file   Years of education: Not on file   Highest education level: Not on file  Occupational History   Not on file  Tobacco Use   Smoking status: Former    Current packs/day: 0.00    Types: Cigarettes    Quit date: 12/29/2013    Years since quitting: 11.0   Smokeless tobacco: Never  Vaping Use   Vaping status: Never Used  Substance and Sexual Activity   Alcohol use: Yes    Alcohol/week: 14.0 standard drinks of alcohol    Types: 14 Glasses of wine per week   Drug use: No   Sexual activity: Not on file  Other Topics Concern   Not on file  Social History Narrative   Not on file   Social Drivers of Health   Tobacco Use: Medium Risk (01/18/2025)   Patient History    Smoking Tobacco Use: Former    Smokeless Tobacco Use: Never    Passive Exposure: Not on file  Financial Resource Strain: Low Risk (05/24/2022)   Received from [facility]   Overall Financial Resource Strain (CARDIA)    Difficulty of Paying Living Expenses: Not hard at all  Food Insecurity: Low Risk (04/07/2024)   Received from [facility]   Epic    Within the past 12 months, you worried that your food would run out before you got money to buy more: Never true    Within the past 12 months, the food you bought just didn't last and you didn't have money to get more. : Never true   Transportation Needs: No Transportation Needs (04/07/2024)   Received from [facility]    In the past 12 months, has lack of reliable transportation kept you from medical appointments, meetings, work or from getting things needed for daily living? : No  Physical Activity: Sufficiently Active (05/24/2022)   Received from [facility] visits prior to 02/28/2023., [facility]   Exercise Vital Sign    On average, how many days per week do you engage in moderate to strenuous exercise (like a brisk walk)?: 6 days  On average, how many minutes do you engage in exercise at this level?: 60 min  Stress: No Stress Concern Present (05/24/2022)   Received from [facility] visits prior to 02/28/2023., [facility]   [employer] of Occupational Health - Occupational Stress Questionnaire    Feeling of Stress : Not at all  Social Connections: Socially Isolated (05/24/2022)   Received from [facility] visits prior to 02/28/2023., [facility]   Social Connection and Isolation Panel    In a typical week, how many times do you talk on the phone with family, friends, or neighbors?: Never    How often do you get together with friends or relatives?: Once a week    How often do you attend church or religious services?: Never    Do you belong to any clubs or organizations such as church groups, unions, fraternal or athletic groups, or school groups?: No    How often do you attend meetings of the clubs or organizations you belong to?: Never    Are you married, widowed, divorced, separated, never married, or living with a partner?: Married  Intimate Partner Violence: Not At Risk (05/24/2022)   Received from [facility] visits prior to 02/28/2023.   Epic    Within the last year, have you been afraid of your partner or ex-partner?: No    Within the last year, have you been humiliated or emotionally abused in other  ways by your partner or ex-partner?: No    Within the last year, have you been kicked, hit, slapped, or otherwise physically hurt by your partner or ex-partner?: No    Within the last year, have you been raped or forced to have any kind of sexual activity by your partner or ex-partner?: No  Depression (PHQ2-9): Not on file  Alcohol Screen: Not on file  Housing: Low Risk (04/07/2024)   Received from [facility]   Epic    What is your living situation today?: I have a steady place to live    Think about the place you live. Do you have problems with any of the following? Choose all that apply:: None/None on this list  Utilities: Low Risk (04/07/2024)   Received from [facility]   Utilities    In the past 12 months has the electric, gas, oil, or water company threatened to shut off services in your home? : No  Health Literacy: Not on file    Review of Systems:  All other review of systems negative except as mentioned in the HPI.  Physical Exam: Vital signs in last 24 hours: BP 121/69   Pulse (!) 55   Temp 97.8 F (36.6 C) (Temporal)   Resp 19   Ht 5' 2 (1.575 m)   Wt 116 lb (52.6 kg)   SpO2 99%   BMI 21.22 kg/m  General:   Alert, NAD Lungs:  Clear .   Heart:  Regular rate and rhythm Abdomen:  Soft, nontender and nondistended. Neuro/Psych:  Alert and cooperative. Normal mood and affect. A and O x 3  Reviewed labs, radiology imaging, old records and pertinent past GI work up  Patient is appropriate for planned procedure(s) and anesthesia in an ambulatory setting   K. Veena Lorilynn Lehr , MD [phone]

## 2025-01-18 NOTE — Progress Notes (Unsigned)
 Pt's states no medical or surgical changes since previsit or office visit.

## 2025-01-18 NOTE — Progress Notes (Unsigned)
 Report to PACU, RN, vss, BBS= Clear.

## 2025-01-18 NOTE — Op Note (Signed)
 [facility] Patient Name: Martha Frank Procedure Date: 01/18/2025 10:59 AM MRN: [id number] Endoscopist: Gustav ALONSO Mcgee , MD, [id number] Age: 64 Referring MD:  Date of Birth: 03/11/1961 Gender: Female Account #: [id number] Procedure:                Colonoscopy Indications:              High risk colon cancer surveillance: Personal                            history of adenoma (10 mm or greater in size), High                            risk colon cancer surveillance: Personal history of                            multiple (3 or more) adenomas Medicines:                Monitored Anesthesia Care Procedure:                Pre-Anesthesia Assessment:                           - Prior to the procedure, a History and Physical                            was performed, and patient medications and                            allergies were reviewed. The patient's tolerance of                            previous anesthesia was also reviewed. The risks                            and benefits of the procedure and the sedation                            options and risks were discussed with the patient.                            All questions were answered, and informed consent                            was obtained. Prior Anticoagulants: The patient has                            taken no anticoagulant or antiplatelet agents. ASA                            Grade Assessment: II - A patient with mild systemic                            disease. After reviewing the risks and benefits,  the patient was deemed in satisfactory condition to                            undergo the procedure.                           After obtaining informed consent, the colonoscope                            was passed under direct vision. Throughout the                            procedure, the patient's blood pressure, pulse, and                            oxygen saturations were  monitored continuously. The                            [id number]-HQ190L Colonoscope [id number] was introduced                            through the anus and advanced to the the cecum,                            identified by appendiceal orifice and ileocecal                            valve. The colonoscopy was performed without                            difficulty. The patient tolerated the procedure                            well. The quality of the bowel preparation was                            good. The ileocecal valve, appendiceal orifice, and                            rectum were photographed. Scope In: 11:14:27 AM Scope Out: 11:28:08 AM Scope Withdrawal Time: 0 hours 8 minutes 7 seconds  Total Procedure Duration: 0 hours 13 minutes 41 seconds  Findings:                 The perianal and digital rectal examinations were                            normal.                           A 1 mm polyp was found in the cecum. The polyp was                            sessile. The polyp was removed with a cold biopsy  forceps. Resection and retrieval were complete.                           An 11 mm polyp was found in the transverse colon.                            The polyp was removed with a cold snare. Resection                            and retrieval were complete.                           Non-bleeding external and internal hemorrhoids were                            found during retroflexion. The hemorrhoids were                            medium-sized. Complications:            No immediate complications. Estimated Blood Loss:     Estimated blood loss was minimal. Impression:               - One 1 mm polyp in the cecum, removed with a cold                            biopsy forceps. Resected and retrieved.                           - One 11 mm polyp in the transverse colon, removed                            with a cold snare. Resected and retrieved.                            - Non-bleeding external and internal hemorrhoids. Recommendation:           - Resume previous diet.                           - Continue present medications.                           - Await pathology results.                           - Repeat colonoscopy in 3 years for surveillance                            based on pathology results. Sherina Stammer V. Syrina Wake, MD 01/18/2025 11:36:35 AM This report has been signed electronically.

## 2025-01-18 NOTE — Patient Instructions (Signed)
Thank you for letting us take care of your healthcare needs today. Please see handouts given to you on Polyps and Hemorrhoids.    YOU HAD AN ENDOSCOPIC PROCEDURE TODAY AT THE [facility]:   Refer to the procedure report that was given to you for any specific questions about what was found during the examination.  If the procedure report does not answer your questions, please call your gastroenterologist to clarify.  If you requested that your care partner not be given the details of your procedure findings, then the procedure report has been included in a sealed envelope for you to review at your convenience later.  YOU SHOULD EXPECT: Some feelings of bloating in the abdomen. Passage of more gas than usual.  Walking can help get rid of the air that was put into your GI tract during the procedure and reduce the bloating. If you had a lower endoscopy (such as a colonoscopy or flexible sigmoidoscopy) you may notice spotting of blood in your stool or on the toilet paper. If you underwent a bowel prep for your procedure, you may not have a normal bowel movement for a few days.  Please Note:  You might notice some irritation and congestion in your nose or some drainage.  This is from the oxygen used during your procedure.  There is no need for concern and it should clear up in a day or so.  SYMPTOMS TO REPORT IMMEDIATELY:  Following lower endoscopy (colonoscopy or flexible sigmoidoscopy):  Excessive amounts of blood in the stool  Significant tenderness or worsening of abdominal pains  Swelling of the abdomen that is new, acute  Fever of 100F or higher   For urgent or emergent issues, a gastroenterologist can be reached at any hour by calling [phone]. Do not use MyChart messaging for urgent concerns.    DIET:  We do recommend a small meal at first, but then you may proceed to your regular diet.  Drink plenty of fluids but you should avoid alcoholic beverages for 24  hours.  ACTIVITY:  You should plan to take it easy for the rest of today and you should NOT DRIVE or use heavy machinery until tomorrow (because of the sedation medicines used during the test).    FOLLOW UP: Our staff will call the number listed on your records the next business day following your procedure.  We will call around 7:15- 8:00 am to check on you and address any questions or concerns that you may have regarding the information given to you following your procedure. If we do not reach you, we will leave a message.     If any biopsies were taken you will be contacted by phone or by letter within the next 1-3 weeks.  Please call us at [phone] if you have not heard about the biopsies in 3 weeks.    SIGNATURES/CONFIDENTIALITY: You and/or your care partner have signed paperwork which will be entered into your electronic medical record.  These signatures attest to the fact that that the information above on your After Visit Summary has been reviewed and is understood.  Full responsibility of the confidentiality of this discharge information lies with you and/or your care-partner. 

## 2025-01-19 ENCOUNTER — Telehealth: Payer: Self-pay | Admitting: *Deleted

## 2025-01-19 NOTE — Telephone Encounter (Signed)
" °  Follow up Call-     01/18/2025   10:22 AM  Call back number  Post procedure Call Back phone  # [phone]  Permission to leave phone message Yes     Patient questions:  Do you have a fever, pain , or abdominal swelling? No. Pain Score  0 *  Have you tolerated food without any problems? Yes.    Have you been able to return to your normal activities? Yes.    Do you have any questions about your discharge instructions: Diet   No. Medications  No. Follow up visit  No.  Do you have questions or concerns about your Care? No.  Actions: * If pain score is 4 or above: No action needed, pain <4.   "

## 2025-01-20 ENCOUNTER — Ambulatory Visit: Payer: Self-pay | Admitting: Gastroenterology

## 2025-01-20 LAB — SURGICAL PATHOLOGY
